# Patient Record
Sex: Male | Born: 1960 | Race: White | Hispanic: No | Marital: Married | Smoking: Former smoker
Health system: Southern US, Community
[De-identification: ages and names within clinical notes are randomized; demographics above are authoritative.]

## PROBLEM LIST (undated history)

## (undated) DIAGNOSIS — H409 Unspecified glaucoma: Secondary | ICD-10-CM

## (undated) DIAGNOSIS — K219 Gastro-esophageal reflux disease without esophagitis: Secondary | ICD-10-CM

## (undated) DIAGNOSIS — R51 Headache: Secondary | ICD-10-CM

## (undated) DIAGNOSIS — G8929 Other chronic pain: Secondary | ICD-10-CM

## (undated) DIAGNOSIS — F419 Anxiety disorder, unspecified: Secondary | ICD-10-CM

## (undated) DIAGNOSIS — M542 Cervicalgia: Secondary | ICD-10-CM

## (undated) DIAGNOSIS — E785 Hyperlipidemia, unspecified: Secondary | ICD-10-CM

## (undated) HISTORY — PX: OTHER SURGICAL HISTORY: SHX169

## (undated) HISTORY — PX: VASECTOMY: SHX75

## (undated) HISTORY — PX: TONSILLECTOMY: SUR1361

---

## 2001-11-15 ENCOUNTER — Encounter (INDEPENDENT_AMBULATORY_CARE_PROVIDER_SITE_OTHER): Payer: Self-pay | Admitting: *Deleted

## 2001-11-15 ENCOUNTER — Ambulatory Visit (HOSPITAL_COMMUNITY): Admission: RE | Admit: 2001-11-15 | Discharge: 2001-11-15 | Payer: Self-pay | Admitting: Gastroenterology

## 2004-03-26 ENCOUNTER — Emergency Department (HOSPITAL_COMMUNITY): Admission: EM | Admit: 2004-03-26 | Discharge: 2004-03-26 | Payer: Self-pay | Admitting: Emergency Medicine

## 2004-12-13 ENCOUNTER — Emergency Department (HOSPITAL_COMMUNITY): Admission: EM | Admit: 2004-12-13 | Discharge: 2004-12-13 | Payer: Self-pay | Admitting: Emergency Medicine

## 2007-04-19 ENCOUNTER — Emergency Department (HOSPITAL_COMMUNITY): Admission: EM | Admit: 2007-04-19 | Discharge: 2007-04-19 | Payer: Self-pay | Admitting: Emergency Medicine

## 2007-07-08 ENCOUNTER — Observation Stay (HOSPITAL_COMMUNITY): Admission: EM | Admit: 2007-07-08 | Discharge: 2007-07-09 | Payer: Self-pay | Admitting: Emergency Medicine

## 2008-05-27 ENCOUNTER — Emergency Department (HOSPITAL_BASED_OUTPATIENT_CLINIC_OR_DEPARTMENT_OTHER): Admission: EM | Admit: 2008-05-27 | Discharge: 2008-05-27 | Payer: Self-pay | Admitting: Emergency Medicine

## 2010-03-18 ENCOUNTER — Emergency Department (HOSPITAL_BASED_OUTPATIENT_CLINIC_OR_DEPARTMENT_OTHER)
Admission: EM | Admit: 2010-03-18 | Discharge: 2010-03-18 | Disposition: A | Discharge status: Home / Self Care | Attending: Emergency Medicine | Admitting: Emergency Medicine

## 2010-03-18 DIAGNOSIS — J329 Chronic sinusitis, unspecified: Secondary | ICD-10-CM | POA: Insufficient documentation

## 2010-03-18 DIAGNOSIS — Z79899 Other long term (current) drug therapy: Secondary | ICD-10-CM | POA: Insufficient documentation

## 2010-03-18 DIAGNOSIS — E789 Disorder of lipoprotein metabolism, unspecified: Secondary | ICD-10-CM | POA: Insufficient documentation

## 2010-03-18 DIAGNOSIS — F3289 Other specified depressive episodes: Secondary | ICD-10-CM | POA: Insufficient documentation

## 2010-03-18 DIAGNOSIS — R070 Pain in throat: Secondary | ICD-10-CM | POA: Insufficient documentation

## 2010-03-18 DIAGNOSIS — K219 Gastro-esophageal reflux disease without esophagitis: Secondary | ICD-10-CM | POA: Insufficient documentation

## 2010-03-18 DIAGNOSIS — J3489 Other specified disorders of nose and nasal sinuses: Secondary | ICD-10-CM | POA: Insufficient documentation

## 2010-03-18 DIAGNOSIS — Z9889 Other specified postprocedural states: Secondary | ICD-10-CM | POA: Insufficient documentation

## 2010-03-18 DIAGNOSIS — F329 Major depressive disorder, single episode, unspecified: Secondary | ICD-10-CM | POA: Insufficient documentation

## 2010-05-05 LAB — RAPID STREP SCREEN (MED CTR MEBANE ONLY): Streptococcus, Group A Screen (Direct): NEGATIVE

## 2010-06-09 NOTE — H&P (Signed)
NAME:  Jordan Barton, Jordan Barton            ACCOUNT NO.:  [id number]   MEDICAL RECORD NO.:  [id number]          PATIENT TYPE:  INP   LOCATION:  3731                         FACILITY:  [facility]   PHYSICIAN:  Beckey Rutter, MD  DATE OF BIRTH:  1960-10-06   DATE OF ADMISSION:  07/08/2007  DATE OF DISCHARGE:                              HISTORY & PHYSICAL   PRIMARY CARE PHYSICIAN:  Annabelle Harman B. Wende Bushy, MD at [facility].  This patient  is unassigned to [facility].   CHIEF COMPLAINT:  Shortness of breath   HISTORY OF PRESENT ILLNESS:  This is a 50 year old pleasant Caucasian  male with past medical history significant for dyslipidemia, depression,  and anxiety came in today because of shortness of breath.  The patient  stated he was feeling some symptoms starting yesterday that he is not  being himself.  This morning, he started to feel shortness of breath and  he is very aware of his respiration.  He often wanted to take deep  breath because he felt he is not breathing enough and sometimes he felt  like he is breathing heavily and he wanted to calm down.  After his  arrival to the emergency department, he started to feel retrosternal  burning sensation that is still continued up to now and he stated that  it felt like heartburn.  The patient has similar presentation previously  to his primary physician and at that time, he had a stress test, as per  him it was negative 1 month ago.  The primary physician prescribed  anxiolytic for him as well as antidepressant.  Currently the patient  denied any stresses and he denied fever, cough and he denied headache.   MEDICAL HISTORY:  1. Dyslipidemia.  2. Depression.  3. Anxiety attacks.   SOCIAL HISTORY:  No drug abuse, nonsmoker, occasional drinker.  He works  as Lawyer, lives in [place] with his wife and daughter.   FAMILY HISTORY:  No significant heart disease in the family.   MEDICATION ALLERGY:  MINOCIN.   MEDICATION:  1.  Aspirin.  2. Celexa.  3. Cyclobenzaprine.  4. Lipitor.  5. Temazepam.  6. Zyprexa.  7. Etodolac.   REVIEW OF SYSTEMS:  As per HPI.  The patient has some sternal burning  sensation that started after arrival to the emergency department.  He  denied severe chest pain or abdominal pain.  He also denied fever, cough  or headache.  The rest of the 12-point review of systems is  nonpertinent.   PHYSICAL EXAMINATION:  VIAL SIGNS:  His temperature 97.8, blood pressure  is 134/68, pulse 70, and respiratory rate is 18.  HEAD:  Atraumatic and normocephalic.  EYES:  PERRL.  MOUTH:  Moist.  No ulcer.  NECK:  Supple.  No JVD.  PRECORDIUM:  First and second heart sounds audible. No added sounds.  LUNGS:  Bilateral fair air entry.  No adventitious sounds.  ABDOMEN:  Soft, nontender.  Bowel sounds present.  EXTREMITIES:  No lower extremities edema.  GENITOURINARY:  No CVA tenderness.  NEUROLOGICAL:  Alert and oriented x3.  Moving all his extremities  spontaneously.   LABS AND X-RAY:  Myoglobin is 74.3, CK-MB is 1.1, troponin is less than  0.05, PTT is 29, PT is 13.6, and INR is 1.0.  Sodium is 139, potassium  4.6 chloride is 104, carbon dioxide 28, glucose 99, BUN 10, creatinine  1.17, and calcium 9.6.  Total protein is 7.2, albumin is 4.1, alkaline  phosphatase is 49, the second troponin is showing less than 0.05 and the  CK-MB is less than 1.0.  White blood count is 7.70, platelet count is  184, hemoglobin is 14.7, and hematocrit is 42.0.  Urinalysis showing  yellow clear urine, negative for nitrate and leukocyte esterase.  EKG is  sinus 68 beats per minute, normal axis, normal interval, normal P, QRS  complex and normal ST and T-wave.  Chest X-Ray:  No acute abnormalities.   ASSESSMENT AND PLAN:  This is a 50 year old male presented with  shortness of breath and chest discomfort.  The chest pain is atypical  for cardiogenic origin.  Nevertheless the patient will be admitted to  rule out  acute coronary syndromes/myocardial infarction.  Shortness of breath and difficulty breathing is likely secondary to  anxiety attack especially with the fact that he has not been compliant  with his antipsychotic medication and he has skipped the dose for the  last 2 days.  1. The patient will be admitted to telemetry floor.  2. We will cycle cardiac enzymes and we will obtain frequent EKGs.  3. The patient will be started on Xanax and we will continue      antidepressant medications.  4. We will give the patient aspirin as well as proton pump inhibitor      to help with his retrosternal burning symptoms.  For deep vein      thrombosis prophylaxis, we will start Lovenox.  For      gastrointestinal prophylaxis, we will start Protonix as discussed      above. We might need to obtain the result of his stress test from      his primary physician.      Beckey Rutter, MD  Electronically Signed     EME/MEDQ  D:  07/08/2007  T:  07/09/2007  Job:  [id number]

## 2010-06-12 NOTE — Op Note (Signed)
   NAME:  Jordan Barton, Jordan Barton                        ACCOUNT NO.:  [id number]   MEDICAL RECORD NO.:  [id number]                   PATIENT TYPE:  AMB   LOCATION:  ENDO                                 FACILITY:  [facility]   PHYSICIAN:  Anselmo Rod, M.D.               DATE OF BIRTH:  22-May-1960   DATE OF PROCEDURE:  11/15/2001  DATE OF DISCHARGE:                                 OPERATIVE REPORT   PROCEDURE PERFORMED:  Colonoscopy with snare polypectomy x1.   ENDOSCOPIST:  Anselmo Rod, M.D.   INSTRUMENT USED:  Olympus videocolonoscope.   INDICATIONS FOR PROCEDURE:  History of rectal bleeding in a 49 year old  white male, rule out colonic polyps, masses, hemorrhoids.   PREPROCEDURE PREPARATION:  Informed consent was procured from the patient.  The patient was fasted for eight hours prior to the procedure and prepped  with a bottle of magnesium Citrate and a gallon of NuLytely the night prior  to the procedure.   PREPROCEDURE PHYSICAL:  VITAL SIGNS:  The patient had stable vital signs.  NECK:  Supple.  CHEST:  Clear to auscultation.  ABDOMEN:  Soft with normal bowel sounds.   DESCRIPTION OF PROCEDURE:  The patient was placed in the left lateral  decubitus position, sedated with 70 mg of Demerol and 7 mg of Versed  intravenously.  Once the patient was adequately sedation and maintained on  low-flow oxygen and continuous cardiac monitoring, the Olympus  videocolonoscope was advanced from the rectum to the cecum without  difficulty.  The patient had some residual stool in the right colon.  Multiple washings were done.  The appendiceal orifice and the ileocecal  valve were clearly visualized and photographed.  A large pedunculated polyp  which seemed inflammatory in nature was snared from 25 cm by snare  polypectomy.  The rest of the colonic mucosa appeared normal.  There was no  evidence of bleeding from the polypectomy site.  The patient tolerated the  procedure well without  complications.  There was no evidence of hemorrhoidal  disease on retroflexion in the rectum or anal inspection.   IMPRESSION:  1. Large inflammatory-appearing pedunculated polyp removed at 25 cm by snare     polypectomy.  2. Otherwise normal-appearing colon up to the cecum.    RECOMMENDATIONS:  1. Await pathology results.  2. Avoid all nonsteroidals including aspirin for the next 3-4 weeks.  3. Outpatient followup in the next two weeks for further recommendations.                                               Anselmo Rod, M.D.    JNM/MEDQ  D:  11/15/2001  T:  11/15/2001  Job:  [id number]   cc:   Donnamarie Rossetti, M.D.

## 2010-08-14 ENCOUNTER — Encounter: Payer: Self-pay | Admitting: *Deleted

## 2010-08-14 ENCOUNTER — Emergency Department (HOSPITAL_BASED_OUTPATIENT_CLINIC_OR_DEPARTMENT_OTHER)
Admission: EM | Admit: 2010-08-14 | Discharge: 2010-08-14 | Disposition: A | Discharge status: Home / Self Care | Attending: Emergency Medicine | Admitting: Emergency Medicine

## 2010-08-14 ENCOUNTER — Emergency Department (INDEPENDENT_AMBULATORY_CARE_PROVIDER_SITE_OTHER)

## 2010-08-14 DIAGNOSIS — R0989 Other specified symptoms and signs involving the circulatory and respiratory systems: Secondary | ICD-10-CM

## 2010-08-14 DIAGNOSIS — R05 Cough: Secondary | ICD-10-CM

## 2010-08-14 DIAGNOSIS — R0981 Nasal congestion: Secondary | ICD-10-CM

## 2010-08-14 DIAGNOSIS — R070 Pain in throat: Secondary | ICD-10-CM

## 2010-08-14 DIAGNOSIS — J40 Bronchitis, not specified as acute or chronic: Secondary | ICD-10-CM | POA: Insufficient documentation

## 2010-08-14 MED ORDER — OXYMETAZOLINE HCL 0.05 % NA SOLN
1.0000 | Freq: Once | NASAL | Status: AC
  Administered 2010-08-14: 1 via NASAL
  Filled 2010-08-14: qty 15

## 2010-08-14 MED ORDER — ALBUTEROL SULFATE HFA 108 (90 BASE) MCG/ACT IN AERS
2.0000 | INHALATION_SPRAY | Freq: Once | RESPIRATORY_TRACT | Status: AC
  Administered 2010-08-14: 2 via RESPIRATORY_TRACT
  Filled 2010-08-14: qty 6.7

## 2010-08-14 NOTE — ED Provider Notes (Signed)
History     Chief Complaint  Patient presents with  . Nasal Congestion   HPI History of Present Illness   Patient Identification Jordan Barton is a 50 y.o. male.  Patient information was obtained from patient. History/Exam limitations: none. Patient presented to the Emergency Department ambulatory.  Chief Complaint  Nasal Congestion   The patient complains of myalgias, nasal congestion, productive cough with sputum described as tan and brown, sore throat and sweats. Onset of symptoms was gradual starting 3 days ago. Symptom course has been worsening, while severity at onset was mild. Symptoms tend to occur with cough. Symptoms are aggravated by nothing, alleviated by nothing and have been associated with cough with tan and brown sputum. Past respiratory history includes none. Patient denies travel or residence in the [facility] [place]. Patient denies travel or residence in [place] country for TB. Patient denies occupational or hobby exposure to irritants such as sulfiting agents, tartrazine, epoxies, chemical, flour, wood dust.) Risks for MI include Age > 67 and male. Risks for PE include none. Care prior to arrival consisted of cough syrup, with minimal relief.   History reviewed. No pertinent past medical history. History reviewed. No pertinent family history. Current Facility-Administered Medications  Medication Dose Route Frequency Provider Last Rate Last Dose  . oxymetazoline (AFRIN) 0.05 % nasal spray 1 spray  1 spray Each Nare Once Ethelda Chick, MD       Current Outpatient Prescriptions  Medication Sig Dispense Refill  . atorvastatin (LIPITOR) 10 MG tablet Take 10 mg by mouth daily.        Marland Kitchen esomeprazole (NEXIUM) 20 MG capsule Take 20 mg by mouth daily before breakfast.         No Known Allergies History   Social History  . Marital Status: Married    Spouse Name: N/A    Number of Children: N/A  . Years of Education: N/A   Occupational History  . Not on file.    Social History Main Topics  . Smoking status: Never Smoker   . Smokeless tobacco: Not on file  . Alcohol Use:   . Drug Use: No  . Sexually Active:    Other Topics Concern  . Not on file   Social History Narrative  . No narrative on file   Review of Systems Pertinent items are noted in HPI. 10 Systems reviewed and are negative for acute change except as noted in the HPI.  Physical Exam   BP 127/82  Pulse 87  Temp 98.5 F (36.9 C)  Resp 20  SpO2 98% BP 127/82  Pulse 87  Temp 98.5 F (36.9 C)  Resp 20  SpO2 98% General appearance: alert, cooperative and no distress Head: Normocephalic, without obvious abnormality, atraumatic Eyes: negative findings: conjunctivae and sclerae normal and pupils equal, round, reactive to light and accomodation Throat: normal findings: lips normal without lesions, palate normal, tongue midline and normal and soft palate, uvula, and tonsils normal Neck: no adenopathy and supple, symmetrical, trachea midline Lungs: clear to auscultation bilaterally and no increased respiratory effort Heart: regular rate and rhythm, S1, S2 normal, no murmur, click, rub or gallop Abdomen: soft, non-tender; bowel sounds normal; no masses,  no organomegaly Extremities: extremities normal, atraumatic, no cyanosis or edema Pulses: 2+ and symmetric Skin: Skin color, texture, turgor normal. No rashes or lesions Lymph nodes: Cervical, supraclavicular, and axillary nodes normal.  ED Course   Studies: Imaging: X-ray: Chest: was negative for infiltrate, effusion, pneumothorax, or wide mediastinum and reviewed by  me  Records Reviewed: Nursing notes.  Treatments: pt given albuterol MDI, afrin nasal spray    Disposition: Home Nonsteroidals Albuterol MDI, afrin nasal spray x 3 days only History reviewed. No pertinent past medical history.  History reviewed. No pertinent past surgical history.  History reviewed. No pertinent family history.  History  Substance  Use Topics  . Smoking status: Never Smoker   . Smokeless tobacco: Not on file  . Alcohol Use:       Review of Systems  Physical Exam  BP 127/82  Pulse 87  Temp 98.5 F (36.9 C)  Resp 20  SpO2 98%  Physical Exam  ED Course  Procedures  MDM Pt with normal CXR, has had cough, nasal congestion and pain with coughing in throat- x 3 days.  Nonsmoker.  No chest pain.  No leg swelling no fever.  Likely bronchitis versus other viral syndrome. Pt given symptomatic treatment in the ED and strict return precautions.  Pt agreeable with plan.

## 2010-08-14 NOTE — ED Notes (Signed)
Pt presents to ED today with nasal congestion and sore throat for the last 3 days.  Pt states pain has steadily increased.  Pt states he tried 2 doses of Robitussin with no relief of sx.  Pt has not tried sudafed or any other nasal decongestion.

## 2010-10-19 LAB — CBC
MCHC: 34.4
MCV: 90.5
Platelets: 235
RBC: 4.62
WBC: 5.7

## 2010-10-19 LAB — DIFFERENTIAL
Basophils Absolute: 0
Basophils Relative: 0
Eosinophils Absolute: 0.1
Eosinophils Relative: 1
Lymphocytes Relative: 34
Lymphs Abs: 1.9
Monocytes Absolute: 0.4
Monocytes Relative: 8
Neutro Abs: 3.3
Neutrophils Relative %: 57

## 2010-10-19 LAB — BASIC METABOLIC PANEL WITH GFR
BUN: 15
CO2: 29
Calcium: 9
Chloride: 105
Creatinine, Ser: 1.06
GFR calc non Af Amer: >60
Glucose, Bld: 99
Potassium: 4.3
Sodium: 139

## 2010-10-19 LAB — POCT CARDIAC MARKERS
Myoglobin, poc: 44
Operator id: 4531

## 2010-10-22 LAB — COMPREHENSIVE METABOLIC PANEL
AST: 24
Albumin: 3.7
Albumin: 4.1
Alkaline Phosphatase: 44
BUN: 10
BUN: 13
CO2: 28
Calcium: 9.6
Chloride: 101
Chloride: 104
Creatinine, Ser: 1.17
Creatinine, Ser: 1.2
Glucose, Bld: 98
Total Bilirubin: 0.8
Total Bilirubin: 1
Total Protein: 6.4
Total Protein: 7.2

## 2010-10-22 LAB — CBC
HCT: 42.8
MCV: 92.7
Platelets: 184
RBC: 4.61
WBC: 7.7

## 2010-10-22 LAB — POCT I-STAT 3, ART BLOOD GAS (G3+)
Acid-Base Excess: 3 — ABNORMAL HIGH
Bicarbonate: 29.1 — ABNORMAL HIGH
Operator id: 276051
pH, Arterial: 7.406

## 2010-10-22 LAB — CK TOTAL AND CKMB (NOT AT ARMC)
CK, MB: 0.7
Total CK: 131

## 2010-10-22 LAB — CARDIAC PANEL(CRET KIN+CKTOT+MB+TROPI)
CK, MB: 0.6
Relative Index: 0.5
Relative Index: 0.5
Total CK: 135
Troponin I: 0.01
Troponin I: <0.01

## 2010-10-22 LAB — POCT I-STAT, CHEM 8
Creatinine, Ser: 1.2
Glucose, Bld: 92
Hemoglobin: 15
Sodium: 139
TCO2: 29

## 2010-10-22 LAB — TROPONIN I: Troponin I: <0.01

## 2010-10-22 LAB — LIPID PANEL
Cholesterol: 119
HDL: 33 — ABNORMAL LOW

## 2010-10-22 LAB — POCT CARDIAC MARKERS
CKMB, poc: <1 — ABNORMAL LOW
Myoglobin, poc: 72.5
Myoglobin, poc: 74.3
Operator id: 196461
Operator id: 196461
Troponin i, poc: <0.05

## 2010-10-22 LAB — URINALYSIS, ROUTINE W REFLEX MICROSCOPIC
Hgb urine dipstick: NEGATIVE
Protein, ur: NEGATIVE
Urobilinogen, UA: 0.2

## 2010-10-22 LAB — APTT: aPTT: 29

## 2010-10-22 LAB — DIFFERENTIAL
Lymphs Abs: 1.3
Monocytes Relative: 6
Neutro Abs: 5.9
Neutrophils Relative %: 77

## 2010-10-22 LAB — D-DIMER, QUANTITATIVE: D-Dimer, Quant: 0.47

## 2010-10-22 LAB — MAGNESIUM: Magnesium: 2.3

## 2010-10-22 LAB — PHOSPHORUS: Phosphorus: 4.3

## 2010-10-22 LAB — PROTIME-INR: Prothrombin Time: 13.6

## 2010-10-22 LAB — TSH: TSH: 2.129

## 2011-12-09 ENCOUNTER — Observation Stay (HOSPITAL_BASED_OUTPATIENT_CLINIC_OR_DEPARTMENT_OTHER)
Admission: EM | Admit: 2011-12-09 | Discharge: 2011-12-09 | Disposition: A | Discharge status: Home / Self Care | Attending: Emergency Medicine | Admitting: Emergency Medicine

## 2011-12-09 ENCOUNTER — Encounter (HOSPITAL_BASED_OUTPATIENT_CLINIC_OR_DEPARTMENT_OTHER): Payer: Self-pay | Admitting: Emergency Medicine

## 2011-12-09 ENCOUNTER — Emergency Department (HOSPITAL_BASED_OUTPATIENT_CLINIC_OR_DEPARTMENT_OTHER)

## 2011-12-09 ENCOUNTER — Observation Stay (HOSPITAL_COMMUNITY)

## 2011-12-09 DIAGNOSIS — R29898 Other symptoms and signs involving the musculoskeletal system: Secondary | ICD-10-CM | POA: Insufficient documentation

## 2011-12-09 DIAGNOSIS — R42 Dizziness and giddiness: Principal | ICD-10-CM | POA: Insufficient documentation

## 2011-12-09 DIAGNOSIS — E785 Hyperlipidemia, unspecified: Secondary | ICD-10-CM | POA: Insufficient documentation

## 2011-12-09 DIAGNOSIS — R0602 Shortness of breath: Secondary | ICD-10-CM | POA: Insufficient documentation

## 2011-12-09 DIAGNOSIS — G459 Transient cerebral ischemic attack, unspecified: Secondary | ICD-10-CM

## 2011-12-09 HISTORY — DX: Anxiety disorder, unspecified: F41.9

## 2011-12-09 HISTORY — DX: Hyperlipidemia, unspecified: E78.5

## 2011-12-09 HISTORY — DX: Gastro-esophageal reflux disease without esophagitis: K21.9

## 2011-12-09 LAB — HEMOGLOBIN A1C
Hgb A1c MFr Bld: 5.7 % — ABNORMAL HIGH (ref <5.7)
Mean Plasma Glucose: 117 mg/dL — ABNORMAL HIGH (ref <117)

## 2011-12-09 LAB — RAPID URINE DRUG SCREEN, HOSP PERFORMED
Benzodiazepines: POSITIVE — AB
Cocaine: NOT DETECTED
Opiates: NOT DETECTED
Tetrahydrocannabinol: NOT DETECTED

## 2011-12-09 LAB — GLUCOSE, CAPILLARY: Glucose-Capillary: 96 mg/dL (ref 70–99)

## 2011-12-09 LAB — PROTIME-INR
INR: 0.91 (ref 0.00–1.49)
Prothrombin Time: 12.2 seconds (ref 11.6–15.2)

## 2011-12-09 LAB — LIPID PANEL
Cholesterol: 165 mg/dL (ref 0–200)
Total CHOL/HDL Ratio: 3.5 RATIO

## 2011-12-09 LAB — CBC WITH DIFFERENTIAL/PLATELET
Basophils Absolute: 0 10*3/uL (ref 0.0–0.1)
Eosinophils Relative: 2 % (ref 0–5)
Lymphocytes Relative: 42 % (ref 12–46)
Lymphs Abs: 2.6 10*3/uL (ref 0.7–4.0)
MCV: 90.7 fL (ref 78.0–100.0)
Neutro Abs: 2.8 10*3/uL (ref 1.7–7.7)
Neutrophils Relative %: 45 % (ref 43–77)
Platelets: 195 10*3/uL (ref 150–400)
RBC: 4.61 MIL/uL (ref 4.22–5.81)
RDW: 13.4 % (ref 11.5–15.5)
WBC: 6.2 10*3/uL (ref 4.0–10.5)

## 2011-12-09 LAB — COMPREHENSIVE METABOLIC PANEL
ALT: 41 U/L (ref 0–53)
AST: 25 U/L (ref 0–37)
Alkaline Phosphatase: 66 U/L (ref 39–117)
CO2: 29 mEq/L (ref 19–32)
Calcium: 9.5 mg/dL (ref 8.4–10.5)
Chloride: 101 mEq/L (ref 96–112)
GFR calc Af Amer: 87 mL/min — ABNORMAL LOW (ref >90)
GFR calc non Af Amer: 75 mL/min — ABNORMAL LOW (ref >90)
Glucose, Bld: 113 mg/dL — ABNORMAL HIGH (ref 70–99)
Potassium: 3.7 mEq/L (ref 3.5–5.1)
Sodium: 140 mEq/L (ref 135–145)

## 2011-12-09 LAB — TROPONIN I: Troponin I: <0.3 ng/mL (ref <0.30)

## 2011-12-09 LAB — CK: Total CK: 137 U/L (ref 7–232)

## 2011-12-09 LAB — URINALYSIS, ROUTINE W REFLEX MICROSCOPIC
Bilirubin Urine: NEGATIVE
Hgb urine dipstick: NEGATIVE
Ketones, ur: NEGATIVE mg/dL
Specific Gravity, Urine: 1.024 (ref 1.005–1.030)
Urobilinogen, UA: 0.2 mg/dL (ref 0.0–1.0)

## 2011-12-09 MED ORDER — ENOXAPARIN SODIUM 40 MG/0.4ML ~~LOC~~ SOLN
40.0000 mg | SUBCUTANEOUS | Status: DC
  Filled 2011-12-09: qty 0.4

## 2011-12-09 MED ORDER — SODIUM CHLORIDE 0.9 % IV BOLUS (SEPSIS)
1000.0000 mL | Freq: Once | INTRAVENOUS | Status: AC
  Administered 2011-12-09: 1000 mL via INTRAVENOUS

## 2011-12-09 MED ORDER — ACETAMINOPHEN 325 MG PO TABS: 650.0000 mg | ORAL_TABLET | ORAL | Status: DC | PRN

## 2011-12-09 MED ORDER — ASPIRIN 300 MG RE SUPP: 300.0000 mg | Freq: Every day | RECTAL | Status: DC

## 2011-12-09 MED ORDER — IBUPROFEN 400 MG PO TABS
600.0000 mg | ORAL_TABLET | Freq: Once | ORAL | Status: AC
  Administered 2011-12-09: 600 mg via ORAL
  Filled 2011-12-09: qty 1

## 2011-12-09 NOTE — ED Provider Notes (Signed)
History     CSN: [id number]  Arrival date & time 12/09/11  0425   First MD Initiated Contact with Patient 12/09/11 0435      Chief Complaint  Patient presents with  . Dizziness    (Consider location/radiation/quality/duration/timing/severity/associated sxs/prior treatment) HPI Level 5 caveat due to confusion Pt brought to the ED by wife who give history. Reports he woke up from sleep a short time prior to arrival complaining of not feeling well, vague complaints of weakness and dizziness, ?R sided weakness. He denies any chest pain. Has had SOB and cough no fever. He was in his normal state of health at bedtime which was >6 hours ago. He has recently begun taking an unknown sleep aid as needed. Wife is unsure if he took any prior to going to bed tonight or if he drank any EtOH tonight.   Past Medical History  Diagnosis Date  . Hyperlipemia     History reviewed. No pertinent past surgical history.  History reviewed. No pertinent family history.  History  Substance Use Topics  . Smoking status: Never Smoker   . Smokeless tobacco: Not on file  . Alcohol Use: Yes     Comment: occasional      Review of Systems Unable to assess due to mental status.    Allergies  Penicillins  Home Medications   Current Outpatient Rx  Name  Route  Sig  Dispense  Refill  . ATORVASTATIN CALCIUM 10 MG PO TABS   Oral   Take 10 mg by mouth daily.           Marland Kitchen ESOMEPRAZOLE MAGNESIUM 20 MG PO CPDR   Oral   Take 20 mg by mouth daily before breakfast.             BP 133/82  Pulse 71  Resp 18  SpO2 100%  Physical Exam  Nursing note and vitals reviewed. Constitutional: He appears well-developed and well-nourished. He appears listless.  HENT:  Head: Normocephalic and atraumatic.  Eyes: EOM are normal. Pupils are equal, round, and reactive to light.  Neck: Normal range of motion. Neck supple.  Cardiovascular: Normal rate, normal heart sounds and intact distal pulses.     Pulmonary/Chest: Effort normal and breath sounds normal.  Abdominal: Bowel sounds are normal. He exhibits no distension. There is no tenderness.  Musculoskeletal: Normal range of motion. He exhibits no edema and no tenderness.  Neurological: He has normal strength. He appears listless. He is disoriented. No cranial nerve deficit or sensory deficit.       Pt very sleepy but arouses to name and answers questions intermittently  Skin: Skin is warm and dry. No rash noted.  Psychiatric: He has a normal mood and affect.    ED Course  Procedures (including critical care time)  Labs Reviewed  COMPREHENSIVE METABOLIC PANEL - Abnormal; Notable for the following:    Glucose, Bld 113 (*)     GFR calc non Af Amer 75 (*)     GFR calc Af Amer 87 (*)     All other components within normal limits  URINE RAPID DRUG SCREEN (HOSP PERFORMED) - Abnormal; Notable for the following:    Benzodiazepines POSITIVE (*)     All other components within normal limits  SALICYLATE LEVEL - Abnormal; Notable for the following:    Salicylate Lvl <2.0 (*)     All other components within normal limits  CBC WITH DIFFERENTIAL  URINALYSIS, ROUTINE W REFLEX MICROSCOPIC  LIPASE, BLOOD  CK  TROPONIN I  ETHANOL  ACETAMINOPHEN LEVEL  PROTIME-INR  APTT  GLUCOSE, CAPILLARY   Dg Chest 2 View  12/09/2011  *RADIOLOGY REPORT*  Clinical Data: Dizziness, altered mental status, cough.  CHEST - 2 VIEW  Comparison: 08/14/2010  Findings: Heart and mediastinal contours are within normal limits. No focal opacities or effusions.  No acute bony abnormality.  IMPRESSION: No active cardiopulmonary disease.   Original Report Authenticated By: Charlett Nose, M.D.    Ct Head Wo Contrast  12/09/2011  *RADIOLOGY REPORT*  Clinical Data: Dizziness, altered mental status.  CT HEAD WITHOUT CONTRAST  Technique:  Contiguous axial images were obtained from the base of the skull through the vertex without contrast.  Comparison: None.  Findings: No  acute intracranial abnormality.  Specifically, no hemorrhage, hydrocephalus, mass lesion, acute infarction, or significant intracranial injury.  No acute calvarial abnormality. Visualized paranasal sinuses and mastoids clear.  Orbital soft tissues unremarkable.  IMPRESSION: Normal study.   Original Report Authenticated By: Charlett Nose, M.D.      No diagnosis found.    MDM   Date: 12/09/2011  Rate: 70  Rhythm: normal sinus rhythm  QRS Axis: normal  Intervals: normal  ST/T Wave abnormalities: normal  Conduction Disutrbances: none  Narrative Interpretation: unremarkable  5:48 AM Pt more alert now, states he did not take any of his sleeping meds last night. He reports R face 'fullness' as an additional symptom he didn't mention earlier. ?TIA as the cause of his symptoms. Will transfer to [facility] for TIA protocol. Discussed with Dr. Nicanor Alcon who will accept.             Charles B. Bernette Mayers, MD 12/09/11 [phone]

## 2011-12-09 NOTE — Progress Notes (Signed)
  Echocardiogram 2D Echocardiogram has been performed.  Jordan Barton 12/09/2011, 8:36 AM

## 2011-12-09 NOTE — ED Notes (Signed)
Patient transported to Ultrasound 

## 2011-12-09 NOTE — ED Notes (Signed)
Pt sent here from [facility] to r/o TIA.  Pt awoke from sleep with R sided numbness, R eye pressure and feeling "out of it".  Per family, they do not feel pt took [place] or xanex last night.  Pt ao x 4 presently.

## 2011-12-09 NOTE — ED Provider Notes (Signed)
7:19 AM Patient has arrived from [facility] for TIA protocol. Vague right-sided symptoms per note. Patient to be placed on TIA protocol for further evaluation.   Per nurse, patient currently asymptomatic.   7:37 AM Patient seen and examined. R arm numbness and tingling resolving -- states still feels a little heavy. No facial sx. Patient and wife agree with plan.   7:38 AM Exam:  Gen NAD; Neck no bruits; Heart RRR, nml S1,S2, no m/r/g; Lungs CTAB; Abd soft, NT, no rebound or guarding; Ext 2+ pedal pulses bilaterally, no edema; Neuro CAOx3, CN III-XII grossly intact, moves all extremities, coordination normal.   10:55 AM Echo, carotids neg. MRI neg. MRI reviewed by myself. Will start on 81mg  ASA per day and have patient f/u with PCP.   Patient has new PCP with [facility]. Encouraged them to obtain records and forward. Patient has upcoming PCP appt already scheduled in 1-2 weeks.   Pt/wife told to return if pt has weakness in his arms or legs, slurred speech, trouble walking or talking, confusion, or trouble with your balance. Patient verbalizes understanding and agrees with plan.       Renne Crigler, [place] 12/09/11 1250

## 2011-12-09 NOTE — ED Notes (Signed)
Pt alert and oriented, pt able to mae's without difficulty, grip strength remains the same on both sides, no facial droop. Pt  States he feels a little disoriented, but is a &o x 4 when questioned, c/o numbness to right side of face

## 2011-12-09 NOTE — ED Notes (Signed)
Pt awoke, dizzy, c/o difficulty thinking

## 2011-12-09 NOTE — Progress Notes (Signed)
VASCULAR LAB PRELIMINARY  PRELIMINARY  PRELIMINARY  PRELIMINARY  Carotid duplex  completed.    Preliminary report:  Bilateral:  No evidence of hemodynamically significant internal carotid artery stenosis.   Vertebral artery flow is antegrade.      Felicha Frayne, RVT 12/09/2011, 8:36 AM

## 2011-12-10 NOTE — ED Provider Notes (Signed)
Medical screening examination/treatment/procedure(s) were performed by non-physician practitioner and as supervising physician I was immediately available for consultation/collaboration.  Geoffery Lyons, MD 12/10/11 [phone]

## 2012-03-21 ENCOUNTER — Other Ambulatory Visit: Payer: Self-pay | Admitting: Orthopedic Surgery

## 2012-03-21 DIAGNOSIS — M5020 Other cervical disc displacement, unspecified cervical region: Secondary | ICD-10-CM

## 2012-03-22 ENCOUNTER — Ambulatory Visit
Admission: RE | Admit: 2012-03-22 | Discharge: 2012-03-22 | Disposition: A | Discharge status: Home / Self Care | Source: Ambulatory Visit | Attending: Orthopedic Surgery | Admitting: Orthopedic Surgery

## 2012-03-22 DIAGNOSIS — M5020 Other cervical disc displacement, unspecified cervical region: Secondary | ICD-10-CM

## 2012-04-04 ENCOUNTER — Encounter (HOSPITAL_COMMUNITY): Payer: Self-pay | Admitting: Pharmacy Technician

## 2012-04-07 ENCOUNTER — Encounter (HOSPITAL_COMMUNITY): Payer: Self-pay

## 2012-04-07 ENCOUNTER — Encounter (HOSPITAL_COMMUNITY)
Admission: RE | Admit: 2012-04-07 | Discharge: 2012-04-07 | Disposition: A | Discharge status: Home / Self Care | Source: Ambulatory Visit | Attending: Orthopedic Surgery | Admitting: Orthopedic Surgery

## 2012-04-07 HISTORY — DX: Other chronic pain: G89.29

## 2012-04-07 HISTORY — DX: Headache: R51

## 2012-04-07 HISTORY — DX: Unspecified glaucoma: H40.9

## 2012-04-07 HISTORY — DX: Cervicalgia: M54.2

## 2012-04-07 LAB — SURGICAL PCR SCREEN: MRSA, PCR: NEGATIVE

## 2012-04-07 NOTE — Progress Notes (Addendum)
Contacted Delsa Grana, NP [phone] office, requested lab work from 03/28/2012

## 2012-04-07 NOTE — Progress Notes (Signed)
04/07/12 1525  OBSTRUCTIVE SLEEP APNEA  Have you ever been diagnosed with sleep apnea through a sleep study? No  Do you snore loudly (loud enough to be heard through closed doors)?  0  Do you often feel tired, fatigued, or sleepy during the daytime? 0  Has anyone observed you stop breathing during your sleep? 0  Do you have, or are you being treated for high blood pressure? 0  BMI more than 35 kg/m2? 1  Age over 53 years old? 1  Neck circumference greater than 40 cm/18 inches? 1  Gender: 1  Obstructive Sleep Apnea Score 4  Score 4 or greater  Results sent to PCP

## 2012-04-07 NOTE — Pre-Procedure Instructions (Signed)
Jordan Barton  04/07/2012   Your procedure is scheduled on:  Monday April 10, 2012  Report to [facility] at 11:00 AM.  Call this number if you have problems the morning of surgery: [phone]   Remember:   Do not eat food or drink liquids after midnight.   Take these medicines the morning of surgery with A SIP OF WATER: valium, nexium, hydrocodone   Do not wear jewelry, make-up or nail polish.  Do not wear lotions, powders, or perfumes.   Do not shave 48 hours prior to surgery. Men may shave face and neck.  Do not bring valuables to the hospital.  Contacts, dentures or bridgework may not be worn into surgery.  Leave suitcase in the car. After surgery it may be brought to your room.  For patients admitted to the hospital, checkout time is 11:00 AM the day of  discharge.   Patients discharged the day of surgery will not be allowed to drive  home.  Name and phone number of your driver: family / friend  Special Instructions: Shower using CHG 2 nights before surgery and the night before surgery.  If you shower the day of surgery use CHG.  Use special wash - you have one bottle of CHG for all showers.  You should use approximately 1/3 of the bottle for each shower.   Please read over the following fact sheets that you were given: Pain Booklet, Coughing and Deep Breathing, MRSA Information and Surgical Site Infection Prevention

## 2012-04-10 ENCOUNTER — Inpatient Hospital Stay (HOSPITAL_COMMUNITY): Admitting: Anesthesiology

## 2012-04-10 ENCOUNTER — Encounter (HOSPITAL_COMMUNITY): Payer: Self-pay | Admitting: *Deleted

## 2012-04-10 ENCOUNTER — Inpatient Hospital Stay (HOSPITAL_COMMUNITY)

## 2012-04-10 ENCOUNTER — Encounter (HOSPITAL_COMMUNITY): Payer: Self-pay | Admitting: Anesthesiology

## 2012-04-10 ENCOUNTER — Encounter (HOSPITAL_COMMUNITY)
Admission: RE | Disposition: A | Discharge status: Home / Self Care | Payer: Self-pay | Source: Ambulatory Visit | Attending: Orthopedic Surgery

## 2012-04-10 ENCOUNTER — Inpatient Hospital Stay (HOSPITAL_COMMUNITY)
Admission: RE | Admit: 2012-04-10 | Discharge: 2012-04-11 | DRG: 473 | Disposition: A | Discharge status: Home / Self Care | Source: Ambulatory Visit | Attending: Orthopedic Surgery | Admitting: Orthopedic Surgery

## 2012-04-10 DIAGNOSIS — Z01812 Encounter for preprocedural laboratory examination: Secondary | ICD-10-CM

## 2012-04-10 DIAGNOSIS — F411 Generalized anxiety disorder: Secondary | ICD-10-CM | POA: Diagnosis present

## 2012-04-10 DIAGNOSIS — M47812 Spondylosis without myelopathy or radiculopathy, cervical region: Principal | ICD-10-CM | POA: Diagnosis present

## 2012-04-10 DIAGNOSIS — E785 Hyperlipidemia, unspecified: Secondary | ICD-10-CM | POA: Diagnosis present

## 2012-04-10 DIAGNOSIS — H409 Unspecified glaucoma: Secondary | ICD-10-CM | POA: Diagnosis present

## 2012-04-10 HISTORY — PX: ANTERIOR CERVICAL DECOMP/DISCECTOMY FUSION: SHX1161

## 2012-04-10 SURGERY — ANTERIOR CERVICAL DECOMPRESSION/DISCECTOMY FUSION 2 LEVEL/HARDWARE REMOVAL
Anesthesia: General | Site: Spine Cervical | Wound class: Clean

## 2012-04-10 MED ORDER — DEXAMETHASONE SODIUM PHOSPHATE 4 MG/ML IJ SOLN
4.0000 mg | Freq: Four times a day (QID) | INTRAMUSCULAR | Status: DC
  Administered 2012-04-10 – 2012-04-11 (×3): 4 mg via INTRAVENOUS
  Filled 2012-04-10 (×7): qty 1

## 2012-04-10 MED ORDER — PROPOFOL 10 MG/ML IV BOLUS
INTRAVENOUS | Status: DC | PRN
  Administered 2012-04-10: 170 mg via INTRAVENOUS
  Administered 2012-04-10: 30 mg via INTRAVENOUS

## 2012-04-10 MED ORDER — 0.9 % SODIUM CHLORIDE (POUR BTL) OPTIME
TOPICAL | Status: DC | PRN
  Administered 2012-04-10: 1000 mL

## 2012-04-10 MED ORDER — ONDANSETRON HCL 4 MG PO TABS: 4.0000 mg | ORAL_TABLET | Freq: Three times a day (TID) | ORAL | Status: AC | PRN

## 2012-04-10 MED ORDER — THROMBIN 20000 UNITS EX SOLR
CUTANEOUS | Status: DC | PRN
  Administered 2012-04-10: 14:00:00 via TOPICAL

## 2012-04-10 MED ORDER — MIDAZOLAM HCL 5 MG/5ML IJ SOLN
INTRAMUSCULAR | Status: DC | PRN
  Administered 2012-04-10: 2 mg via INTRAVENOUS

## 2012-04-10 MED ORDER — HYDROMORPHONE HCL PF 1 MG/ML IJ SOLN
0.2500 mg | INTRAMUSCULAR | Status: DC | PRN
  Administered 2012-04-10: 0.5 mg via INTRAVENOUS

## 2012-04-10 MED ORDER — ONDANSETRON HCL 4 MG/2ML IJ SOLN
INTRAMUSCULAR | Status: DC | PRN
  Administered 2012-04-10: 4 mg via INTRAVENOUS

## 2012-04-10 MED ORDER — CEFAZOLIN SODIUM-DEXTROSE 2-3 GM-% IV SOLR
2.0000 g | INTRAVENOUS | Status: AC
  Administered 2012-04-10: 2 g via INTRAVENOUS

## 2012-04-10 MED ORDER — PHENOL 1.4 % MT LIQD: 1.0000 | OROMUCOSAL | Status: DC | PRN

## 2012-04-10 MED ORDER — LACTATED RINGERS IV SOLN
INTRAVENOUS | Status: DC
  Administered 2012-04-10: 13:00:00 via INTRAVENOUS

## 2012-04-10 MED ORDER — HYDROMORPHONE HCL PF 1 MG/ML IJ SOLN
INTRAMUSCULAR | Status: AC
  Administered 2012-04-10: 0.5 mg
  Filled 2012-04-10: qty 1

## 2012-04-10 MED ORDER — DEXAMETHASONE SODIUM PHOSPHATE 4 MG/ML IJ SOLN
4.0000 mg | Freq: Once | INTRAMUSCULAR | Status: DC
  Filled 2012-04-10: qty 1

## 2012-04-10 MED ORDER — ACETAMINOPHEN 10 MG/ML IV SOLN: 1000.0000 mg | Freq: Once | INTRAVENOUS | Status: DC | PRN

## 2012-04-10 MED ORDER — DEXAMETHASONE SODIUM PHOSPHATE 10 MG/ML IJ SOLN
INTRAMUSCULAR | Status: AC
  Administered 2012-04-10: 10 mg via INTRAVENOUS
  Filled 2012-04-10: qty 1

## 2012-04-10 MED ORDER — OXYCODONE HCL 5 MG PO TABS
10.0000 mg | ORAL_TABLET | ORAL | Status: DC | PRN
  Administered 2012-04-11: 10 mg via ORAL
  Filled 2012-04-10: qty 2

## 2012-04-10 MED ORDER — ACETAMINOPHEN 10 MG/ML IV SOLN
1000.0000 mg | Freq: Four times a day (QID) | INTRAVENOUS | Status: DC
  Administered 2012-04-10 – 2012-04-11 (×3): 1000 mg via INTRAVENOUS
  Filled 2012-04-10 (×4): qty 100

## 2012-04-10 MED ORDER — METHOCARBAMOL 100 MG/ML IJ SOLN: 500.0000 mg | Freq: Four times a day (QID) | INTRAMUSCULAR | Status: DC | PRN

## 2012-04-10 MED ORDER — LIDOCAINE HCL (CARDIAC) 20 MG/ML IV SOLN
INTRAVENOUS | Status: DC | PRN
  Administered 2012-04-10: 100 mg via INTRAVENOUS

## 2012-04-10 MED ORDER — DEXAMETHASONE 4 MG PO TABS
4.0000 mg | ORAL_TABLET | Freq: Four times a day (QID) | ORAL | Status: DC
  Administered 2012-04-11: 4 mg via ORAL
  Filled 2012-04-10 (×7): qty 1

## 2012-04-10 MED ORDER — NEOSTIGMINE METHYLSULFATE 1 MG/ML IJ SOLN
INTRAMUSCULAR | Status: DC | PRN
  Administered 2012-04-10: 2 mg via INTRAVENOUS

## 2012-04-10 MED ORDER — BUPIVACAINE-EPINEPHRINE 0.25% -1:200000 IJ SOLN
INTRAMUSCULAR | Status: AC
  Filled 2012-04-10: qty 1

## 2012-04-10 MED ORDER — PHENYLEPHRINE HCL 10 MG/ML IJ SOLN
INTRAMUSCULAR | Status: DC | PRN
  Administered 2012-04-10 (×3): 40 ug via INTRAVENOUS
  Administered 2012-04-10: 80 ug via INTRAVENOUS
  Administered 2012-04-10 (×4): 40 ug via INTRAVENOUS
  Administered 2012-04-10: 80 ug via INTRAVENOUS
  Administered 2012-04-10 (×2): 40 ug via INTRAVENOUS

## 2012-04-10 MED ORDER — MENTHOL 3 MG MT LOZG
1.0000 | LOZENGE | OROMUCOSAL | Status: DC | PRN
  Administered 2012-04-10: 3 mg via ORAL
  Filled 2012-04-10: qty 9

## 2012-04-10 MED ORDER — BUPIVACAINE-EPINEPHRINE 0.25% -1:200000 IJ SOLN
INTRAMUSCULAR | Status: DC | PRN
  Administered 2012-04-10: 7 mL

## 2012-04-10 MED ORDER — LACTATED RINGERS IV SOLN
INTRAVENOUS | Status: DC | PRN
  Administered 2012-04-10 (×2): via INTRAVENOUS

## 2012-04-10 MED ORDER — ROCURONIUM BROMIDE 100 MG/10ML IV SOLN
INTRAVENOUS | Status: DC | PRN
  Administered 2012-04-10: 50 mg via INTRAVENOUS

## 2012-04-10 MED ORDER — LACTATED RINGERS IV SOLN
INTRAVENOUS | Status: DC
  Administered 2012-04-10 – 2012-04-11 (×2): via INTRAVENOUS

## 2012-04-10 MED ORDER — OXYCODONE-ACETAMINOPHEN 10-325 MG PO TABS: 1.0000 | ORAL_TABLET | ORAL | Status: DC | PRN

## 2012-04-10 MED ORDER — SODIUM CHLORIDE 0.9 % IJ SOLN: 3.0000 mL | INTRAMUSCULAR | Status: DC | PRN

## 2012-04-10 MED ORDER — ATORVASTATIN CALCIUM 10 MG PO TABS
10.0000 mg | ORAL_TABLET | Freq: Every day | ORAL | Status: DC
  Administered 2012-04-10 – 2012-04-11 (×2): 10 mg via ORAL
  Filled 2012-04-10 (×2): qty 1

## 2012-04-10 MED ORDER — METHOCARBAMOL 500 MG PO TABS: 500.0000 mg | ORAL_TABLET | Freq: Three times a day (TID) | ORAL | Status: DC | PRN

## 2012-04-10 MED ORDER — FENTANYL CITRATE 0.05 MG/ML IJ SOLN
INTRAMUSCULAR | Status: DC | PRN
  Administered 2012-04-10: 50 ug via INTRAVENOUS
  Administered 2012-04-10 (×3): 100 ug via INTRAVENOUS

## 2012-04-10 MED ORDER — POLYETHYLENE GLYCOL 3350 17 GM/SCOOP PO POWD: 17.0000 g | Freq: Every day | ORAL | Status: DC

## 2012-04-10 MED ORDER — PANTOPRAZOLE SODIUM 40 MG PO TBEC
40.0000 mg | DELAYED_RELEASE_TABLET | Freq: Every day | ORAL | Status: DC
  Administered 2012-04-10 – 2012-04-11 (×2): 40 mg via ORAL
  Filled 2012-04-10 (×2): qty 1

## 2012-04-10 MED ORDER — METHOCARBAMOL 500 MG PO TABS: 500.0000 mg | ORAL_TABLET | Freq: Four times a day (QID) | ORAL | Status: DC | PRN

## 2012-04-10 MED ORDER — HEMOSTATIC AGENTS (NO CHARGE) OPTIME
TOPICAL | Status: DC | PRN
  Administered 2012-04-10: 1 via TOPICAL

## 2012-04-10 MED ORDER — MORPHINE SULFATE 2 MG/ML IJ SOLN
1.0000 mg | INTRAMUSCULAR | Status: DC | PRN
  Administered 2012-04-10 (×2): 2 mg via INTRAVENOUS
  Filled 2012-04-10 (×2): qty 1

## 2012-04-10 MED ORDER — CEFAZOLIN SODIUM 1-5 GM-% IV SOLN
1.0000 g | Freq: Three times a day (TID) | INTRAVENOUS | Status: AC
  Administered 2012-04-10 – 2012-04-11 (×2): 1 g via INTRAVENOUS
  Filled 2012-04-10 (×2): qty 50

## 2012-04-10 MED ORDER — ZOLPIDEM TARTRATE 5 MG PO TABS
5.0000 mg | ORAL_TABLET | Freq: Every evening | ORAL | Status: DC | PRN
  Administered 2012-04-10: 5 mg via ORAL
  Filled 2012-04-10: qty 1

## 2012-04-10 MED ORDER — ONDANSETRON HCL 4 MG/2ML IJ SOLN: 4.0000 mg | INTRAMUSCULAR | Status: DC | PRN

## 2012-04-10 MED ORDER — ONDANSETRON HCL 4 MG/2ML IJ SOLN: 4.0000 mg | Freq: Once | INTRAMUSCULAR | Status: DC | PRN

## 2012-04-10 MED ORDER — SODIUM CHLORIDE 0.9 % IJ SOLN: 3.0000 mL | Freq: Two times a day (BID) | INTRAMUSCULAR | Status: DC

## 2012-04-10 MED ORDER — GLYCOPYRROLATE 0.2 MG/ML IJ SOLN
INTRAMUSCULAR | Status: DC | PRN
  Administered 2012-04-10: 0.2 mg via INTRAVENOUS

## 2012-04-10 MED ORDER — SODIUM CHLORIDE 0.9 % IV SOLN: 250.0000 mL | INTRAVENOUS | Status: DC

## 2012-04-10 MED ORDER — DOCUSATE SODIUM 100 MG PO CAPS: 100.0000 mg | ORAL_CAPSULE | Freq: Three times a day (TID) | ORAL | Status: DC | PRN

## 2012-04-10 MED ORDER — ACETAMINOPHEN 10 MG/ML IV SOLN
1000.0000 mg | Freq: Once | INTRAVENOUS | Status: AC
  Administered 2012-04-10: 1000 mg via INTRAVENOUS
  Filled 2012-04-10: qty 100

## 2012-04-10 MED ORDER — THROMBIN 20000 UNITS EX SOLR
CUTANEOUS | Status: AC
  Filled 2012-04-10: qty 20000

## 2012-04-10 SURGICAL SUPPLY — 59 items
BLADE SURG 15 STRL LF DISP TIS (BLADE) IMPLANT
BLADE SURG 15 STRL SS (BLADE)
BLADE SURG ROTATE 9660 (MISCELLANEOUS) IMPLANT
BUR EGG ELITE 4.0 (BURR) IMPLANT
BUR MATCHSTICK NEURO 3.0 LAGG (BURR) IMPLANT
CANISTER SUCTION 2500CC (MISCELLANEOUS) ×2 IMPLANT
CLOTH BEACON ORANGE TIMEOUT ST (SAFETY) ×2 IMPLANT
CLSR STERI-STRIP ANTIMIC 1/2X4 (GAUZE/BANDAGES/DRESSINGS) ×2 IMPLANT
CORDS BIPOLAR (ELECTRODE) ×2 IMPLANT
COVER SURGICAL LIGHT HANDLE (MISCELLANEOUS) ×4 IMPLANT
CRADLE DONUT ADULT HEAD (MISCELLANEOUS) ×2 IMPLANT
DRAPE C-ARM 42X72 X-RAY (DRAPES) ×2 IMPLANT
DRAPE INCISE IOBAN 66X45 STRL (DRAPES) ×2 IMPLANT
DRAPE POUCH INSTRU U-SHP 10X18 (DRAPES) ×2 IMPLANT
DRAPE SURG 17X23 STRL (DRAPES) ×2 IMPLANT
DRAPE U-SHAPE 47X51 STRL (DRAPES) ×2 IMPLANT
DRSG MEPILEX BORDER 4X4 (GAUZE/BANDAGES/DRESSINGS) ×2 IMPLANT
DURAPREP 26ML APPLICATOR (WOUND CARE) ×2 IMPLANT
ELECT COATED BLADE 2.86 ST (ELECTRODE) ×2 IMPLANT
ELECT REM PT RETURN 9FT ADLT (ELECTROSURGICAL) ×2
ELECTRODE REM PT RTRN 9FT ADLT (ELECTROSURGICAL) ×1 IMPLANT
ENDOSKELETON LG TC 6VBR 8MM (Orthopedic Implant) ×2 IMPLANT
GLOVE BIOGEL PI IND STRL 8.5 (GLOVE) ×1 IMPLANT
GLOVE BIOGEL PI INDICATOR 8.5 (GLOVE) ×1
GLOVE ECLIPSE 8.5 STRL (GLOVE) ×2 IMPLANT
GOWN PREVENTION PLUS XXLARGE (GOWN DISPOSABLE) ×2 IMPLANT
GOWN STRL REIN XL XLG (GOWN DISPOSABLE) ×4 IMPLANT
KIT BASIN OR (CUSTOM PROCEDURE TRAY) ×2 IMPLANT
KIT ROOM TURNOVER OR (KITS) ×2 IMPLANT
NDL SPNL 18GX3.5 QUINCKE PK (NEEDLE) ×1 IMPLANT
NEEDLE SPNL 18GX3.5 QUINCKE PK (NEEDLE) ×2 IMPLANT
NS IRRIG 1000ML POUR BTL (IV SOLUTION) ×3 IMPLANT
PACK ORTHO CERVICAL (CUSTOM PROCEDURE TRAY) ×2 IMPLANT
PACK UNIVERSAL I (CUSTOM PROCEDURE TRAY) ×2 IMPLANT
PAD ARMBOARD 7.5X6 YLW CONV (MISCELLANEOUS) ×4 IMPLANT
PATTIES SURGICAL .25X.25 (GAUZE/BANDAGES/DRESSINGS) ×2 IMPLANT
PIN DISTRACTION 14 (PIN) ×1 IMPLANT
PLATE SKYLINE 2 LEVEL 34MM (Plate) ×1 IMPLANT
PRO DISC C RETAINER PIN 14MM ×2 IMPLANT
PUTTY BONE DBX 5CC MIX (Putty) ×2 IMPLANT
RESTRAINT LIMB HOLDER UNIV (RESTRAINTS) ×2 IMPLANT
SCREW SKYLINE 14MM SD-VA (Screw) ×6 IMPLANT
SPONGE INTESTINAL PEANUT (DISPOSABLE) ×4 IMPLANT
SPONGE LAP 4X18 X RAY DECT (DISPOSABLE) IMPLANT
SPONGE SURGIFOAM ABS GEL 100 (HEMOSTASIS) ×2 IMPLANT
SURGIFLO TRUKIT (HEMOSTASIS) ×2 IMPLANT
SUT BONE WAX W31G (SUTURE) ×1 IMPLANT
SUT MNCRL AB 3-0 PS2 18 (SUTURE) ×2 IMPLANT
SUT SILK 2 0 (SUTURE)
SUT SILK 2-0 18XBRD TIE 12 (SUTURE) IMPLANT
SUT VIC AB 2-0 CT1 18 (SUTURE) ×2 IMPLANT
SYR BULB IRRIGATION 50ML (SYRINGE) ×2 IMPLANT
SYR CONTROL 10ML LL (SYRINGE) ×2 IMPLANT
TAPE CLOTH 4X10 WHT NS (GAUZE/BANDAGES/DRESSINGS) ×2 IMPLANT
TAPE UMBILICAL COTTON 1/8X30 (MISCELLANEOUS) ×2 IMPLANT
TOWEL OR 17X24 6PK STRL BLUE (TOWEL DISPOSABLE) ×2 IMPLANT
TOWEL OR 17X26 10 PK STRL BLUE (TOWEL DISPOSABLE) ×2 IMPLANT
TRAY FOLEY CATH 14FR (SET/KITS/TRAYS/PACK) IMPLANT
WATER STERILE IRR 1000ML POUR (IV SOLUTION) ×1 IMPLANT

## 2012-04-10 NOTE — Progress Notes (Signed)
Complaint of sore thraot headache, and neck is hurting, dr. Ivin Booty at bedside to use pain med pt already had tylenol

## 2012-04-10 NOTE — Brief Op Note (Signed)
04/10/2012  4:33 PM  PATIENT:  Jordan Barton  52 y.o. male  PRE-OPERATIVE DIAGNOSIS:  Cervical Spondylotic Radiculopathy  POST-OPERATIVE DIAGNOSIS:  Cervical Spondylotic Radiculopathy  PROCEDURE:  Procedure(s): ANTERIOR CERVICAL DISCECTOMY FUSION C5 - C7 2 LEVEL (N/A)  SURGEON:  Surgeon(s) and Role:    * Venita Lick, MD - Primary  PHYSICIAN ASSISTANT:   ASSISTANTS: none   ANESTHESIA:   general  EBL:  Total I/O In: 1000 [I.V.:1000] Out: -   BLOOD ADMINISTERED:none  DRAINS: none   LOCAL MEDICATIONS USED:  MARCAINE     SPECIMEN:  No Specimen  DISPOSITION OF SPECIMEN:  N/A  COUNTS:  YES  TOURNIQUET:  * No tourniquets in log *  DICTATION: .Other Dictation: Dictation Number [id number]  PLAN OF CARE: Admit to inpatient   PATIENT DISPOSITION:  PACU - hemodynamically stable.

## 2012-04-10 NOTE — Anesthesia Preprocedure Evaluation (Signed)
Anesthesia Evaluation  Patient identified by MRN, date of birth, ID band Patient awake    Reviewed: Allergy & Precautions, H&P , NPO status , Patient's Chart, lab work & pertinent test results  Airway       Dental  (+) Teeth Intact and Dental Advisory Given   Pulmonary  breath sounds clear to auscultation        Cardiovascular Rhythm:Regular Rate:Normal     Neuro/Psych    GI/Hepatic   Endo/Other    Renal/GU      Musculoskeletal   Abdominal (+) + obese,   Peds  Hematology   Anesthesia Other Findings   Reproductive/Obstetrics                           Anesthesia Physical Anesthesia Plan  ASA: II  Anesthesia Plan: General   Post-op Pain Management:    Induction: Intravenous  Airway Management Planned: Oral ETT  Additional Equipment:   Intra-op Plan:   Post-operative Plan: Extubation in OR  Informed Consent: I have reviewed the patients History and Physical, chart, labs and discussed the procedure including the risks, benefits and alternatives for the proposed anesthesia with the patient or authorized representative who has indicated his/her understanding and acceptance.   Dental advisory given  Plan Discussed with: CRNA and Surgeon  Anesthesia Plan Comments:         Anesthesia Quick Evaluation

## 2012-04-10 NOTE — Transfer of Care (Signed)
Immediate Anesthesia Transfer of Care Note  Patient: Jordan Barton  Procedure(s) Performed: Procedure(s): ANTERIOR CERVICAL DISCECTOMY FUSION C5 - C7 2 LEVEL (N/A)  Patient Location: PACU  Anesthesia Type:General  Level of Consciousness: awake, alert  and oriented  Airway & Oxygen Therapy: Patient Spontanous Breathing and Patient connected to nasal cannula oxygen  Post-op Assessment: Report given to PACU RN, Post -op Vital signs reviewed and stable and Patient moving all extremities X 4  Post vital signs: Reviewed and stable  Complications: No apparent anesthesia complications

## 2012-04-10 NOTE — H&P (Signed)
History of Present Illness The patient is a 52 year old male who presents with neck pain. The patient is seen today in referral from Dr. Geni Bers. The patient reports symptoms involving the entire neck (primary pain is on the left side going down the shoulder and into the arm) which began 3 week(s) ago. The symptoms began without any known injury (Pt. woke up and his neck felt like he had slept wrong. He went to his primary care doctor after 3 days of it constantly hurting and getting worse. ). Symptoms include neck pain, numbness, tingling, upper extremity weakness (left arm) and weakness (left arm.). The patient describes the pain as sharp.The patient describes their symptoms as moderate in severity.The patient does feel that the symptoms are worsening. Current treatment includes muscle relaxants and opioid analgesics. 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 Note for "Neck pain": He was also perscribed Prednisone 20 MG and Tizanidine HCL 4 MG by his primary care doctor but stopped taking them until his first visit here.  Subjective Transcription  Jordan Barton is a very pleasant, active, young gentleman, who was in his usual state of good health without any significant neck or left arm pain until the 28th of January. He simply awoke with significant neck pain which rapidly progressed into significant left arm pain. During the course of the last 4 weeks, he's been to the ER once. He's been to his PCP a few times. He has had 2 Medrol Dosepacks, narcotic medications and muscle relaxers. He states over the last week he's been off his narcotics, taking only Aleve, and the pain is somewhat better than the prior 3 weeks. Overall his quality of life, his ability to function and his ability to return to work have all been hindered.    Allergies Minocin *Tetracyclines**   Family History Cancer. father   Social History Alcohol use. current drinker; drinks  wine; only occasionally per week Children. 2 Current work status. working full time Drug/Alcohol Rehab (Currently). no Drug/Alcohol Rehab (Previously). no Exercise. Exercises weekly; does running / walking Illicit drug use. no Living situation. live with spouse Marital status. married Number of flights of stairs before winded. 4-5 Pain Contract. no Tobacco / smoke exposure. no Tobacco use. former smoker; smoke(d) 1 pack(s) per day   Medication History Lipitor (10MG  Tablet, Oral) Active. NexIUM (5MG  Packet, Oral) Active. Ambien (10MG  Tablet, Oral) Active.   Past Surgical History Colon Polyp Removal - Colonoscopy Straighten Nasal Septum   Other Problems Anxiety Disorder   Review of Systems General:Not Present- Chills, Fever, Night Sweats, Appetite Loss, Fatigue, Feeling sick, Weight Gain and Weight Loss. Skin:Not Present- Itching, Rash, Skin Color Changes, Ulcer, Psoriasis and Change in Hair or Nails. HEENT:Not Present- Sensitivity to light, Hearing problems, Nose Bleed and Ringing in the Ears. Neck:Not Present- Swollen Glands and Neck Mass. Respiratory:Not Present- Snoring, Chronic Cough, Bloody sputum and Dyspnea. Cardiovascular:Not Present- Shortness of Breath, Chest Pain, Swelling of Extremities, Leg Cramps and Palpitations. Gastrointestinal:Not Present- Bloody Stool, Heartburn, Abdominal Pain, Vomiting, Nausea and Incontinence of Stool. Male Genitourinary:Not Present- Blood in Urine, Frequency, Incontinence and Nocturia. Musculoskeletal:Present- Muscle Pain and Back Pain. Not Present- Muscle Weakness, Joint Stiffness, Joint Swelling and Joint Pain. Neurological:Present- Numbness and Burning. Not Present- Tingling, Tremor, Headaches and Dizziness. Psychiatric:Not Present- Anxiety, Depression and Memory Loss. Endocrine:Not Present- Cold Intolerance, Heat Intolerance, Excessive hunger and Excessive Thirst. Hematology:Not Present- Abnormal  Bleeding, Anemia, Blood Clots and Easy Bruising.   Vitals 03/21/2012 8:23 AM Weight: 255  lb Height: 77 in Body Surface Area: 2.51 m Body Mass Index: 30.24 kg/m Pulse: 71 (Regular) BP: 135/81 (Sitting, Left Arm, Standard)    Objective Transcription  On clinical exam, he's a pleasant man, who appears younger than his stated age. He is alert. He's oriented times 3. He has very limited cervical spine ROM due to pain. Positive Lhermitte's sign on the left side. Positive Spurling's sign on the left side. Reduction in pain with the head held overtop his head. No shortness of breath or chest pain. The abdomen is soft and nontender. No history of incontinence or bowel or bladder. Normal gait pattern. Negative Hoffman's sign. Negative Babinski sign. No clonus. He has 1+ DTRs throughout in both extremities bilaterally. He has 4/5 biceps, wrist extensor and triceps strength on the left. He has 5/5 deltoid and grip strength on the left. The right side is 5/5 throughout. He has sensory changes just in the proximal aspect of the arm. No significant numbness or dysesthesias below the level of the elbow. He has no neck swelling or palpable mass.  Repeat Evaluation:  The patient is a pleasant gentleman who appears his stated age in no acute distress. He is alert and oriented times three.  On examination, he has significant pain with cervical spine range of motion. Positive Lhermitte sign on the left side. Positive Spurling sign on the left side. He has reduced pain again with the arm placed overhead. He has four out of five biceps, wrist extensor and triceps strength on the left, deltoid is five out of five, right side has five out of five strength throughout. Diminished sensation predominately in the C6 and C7 dermatome. He has one plus symmetrical deep tendon reflexes throughout. Negative Babinski test. Negative Hoffmann sign. Negative clonus.  Respiratory, no shortness of  breath or chest pain.  Abdomen, soft and nontender.  Imaging: He had an MRI of his thoracic and lumbar spine March 03, 2012. He did have significant motion degradation and motion artifact but there is disc protrusion, left side foraminal compromise at C5-6 and 6-7 and, to a lesser degree, 3-4 and 4-5. There is no apparent cord signal changes, no other soft tissue abnormalities that I can see. The official radiology report is still not yet available. It is being faxed to me.   Two level cervical disk pathology C5-6, C6-7 with posterolateral to the left hard disk osteophyte causing nerve compression and foraminal compromise. There is no cord signal change.   Plans Transcription  At this point in time, I am concerned about his C7 and C6 weakness. The progression of his symptoms is characteristic of nerve root compression. His clinical exam and MRI both are consistent with C6 and C7 neuropathic radiculopathy; however, at this point we have talked about injection therapy and surgical intervention as well as P.T. Given the weakness that he has, I am recommending that we consider surgical decompression. It has already been 4 weeks. He has not made significant improvement with respect to his neurological function. While his pain is slowly improving, we have actually seen a deterioration in his strength which would indicate a progressive neurological deficit. At this point, because there is significant motion degradation and motion artifact on his current MRI, I am going to recommend that we get a new MRI at [facility] so that we can get a better picture of his cervical spine pathology. I am also going to give him a prescription for Valium and Norco. He's had Percocet in the past  but it caused a generalized heightened anxiety for him. I will see him back once he has the new MRI and we can determine in greater detail the specific surgery, but at this point I think the most likely option  is going to be a 2-level anterior cervical discectomy and fusion. In order to expedite this, we will get preoperative medical clearance from Dr. Dareen Piano. 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   At this point in time, I had a long discussion with the patient and his wife. They were present for my dictation. I have indicated to the patient that he has signs and symptoms consistent with cervical radiculopathy in the C6 and C7 distribution. In order to address this, we could use injection therapy and physical therapy or surgical intervention. We reviewed the risks of surgery which include infection, bleeding, nerve damage, death, stroke, paralysis, failure to heal, need for further surgery, ongoing or worse pain, loss of bowel and bladder control, throat pain, swallowing difficulty, need for posterior surgery, nonunion and adjacent segment disease. They have all expressed an understanding of the risks.   Plans Transcription  Given the severity of the patient's radiculopathy (neurological deficit) and his loss in quality of life he would like to proceed with surgery. Given the fact that it has been almost six weeks and he is not improving I think it is reasonable to proceed. We will go ahead and set up the patient for a two-level anterior cervical diskectomy and fusion C5-6, C6-7 for the near future.

## 2012-04-10 NOTE — Anesthesia Postprocedure Evaluation (Signed)
  Anesthesia Post-op Note  Patient: Jordan Barton  Procedure(s) Performed: Procedure(s): ANTERIOR CERVICAL DISCECTOMY FUSION C5 - C7 2 LEVEL (N/A)  Patient Location: PACU  Anesthesia Type:General  Level of Consciousness: awake, alert  and oriented  Airway and Oxygen Therapy: Patient Spontanous Breathing and Patient connected to nasal cannula oxygen  Post-op Pain: mild  Post-op Assessment: Post-op Vital signs reviewed, Patient's Cardiovascular Status Stable, Respiratory Function Stable, Patent Airway and Pain level controlled  Post-op Vital Signs: stable  Complications: No apparent anesthesia complications

## 2012-04-10 NOTE — Preoperative (Signed)
Beta Blockers   Reason not to administer Beta Blockers:Not Applicable 

## 2012-04-11 MED ORDER — HYDROCODONE-ACETAMINOPHEN 10-325 MG PO TABS: 1.0000 | ORAL_TABLET | Freq: Four times a day (QID) | ORAL | Status: DC | PRN

## 2012-04-11 MED ORDER — HYDROCODONE-ACETAMINOPHEN 10-325 MG PO TABS
1.0000 | ORAL_TABLET | Freq: Four times a day (QID) | ORAL | Status: DC | PRN
  Administered 2012-04-11: 2 via ORAL
  Filled 2012-04-11: qty 2

## 2012-04-11 NOTE — Evaluation (Signed)
Physical Therapy Evaluation Patient Details Name: Jordan Barton MRN: [id number] DOB: 1960/10/28 Today's Date: 04/11/2012 Time: 4098-1191 PT Time Calculation (min): 42 min  PT Assessment / Plan / Recommendation Clinical Impression  Pt is a 52 y/o male s/p 2 level anterior cervical fusion.  Pt demonstrates independence with funcitonal mobility.  Extended time spent educating pt and spouse on rehab process and cervical precautions including proper wearing of Cervical brace.      PT Assessment  Patent does not need any further PT services    Follow Up Recommendations  No PT follow up    Does the patient have the potential to tolerate intense rehabilitation      Barriers to Discharge        Equipment Recommendations  None recommended by PT    Recommendations for Other Services     Frequency      Precautions / Restrictions Precautions Precautions: Cervical Precaution Comments: Educated pt on cervical precautions and use of Aspen Collar.  Required Braces or Orthoses: Cervical Brace Cervical Brace: Hard collar;Applied in sitting position Restrictions Weight Bearing Restrictions: No   Pertinent Vitals/Pain Pt c/o mild pain where c-collar meets back of pt's head Adjusted collar and pain resolved.        Mobility  Bed Mobility Bed Mobility: Rolling Right;Right Sidelying to Sit;Sit to Sidelying Right Rolling Right: 5: Supervision;6: Modified independent (Device/Increase time) Right Sidelying to Sit: 6: Modified independent (Device/Increase time);5: Supervision Sit to Sidelying Right: 6: Modified independent (Device/Increase time);5: Supervision;HOB flat Details for Bed Mobility Assistance: Pt demonstrating proper technique to maintain cervical precautions.   Transfers Transfers: Sit to Stand;Stand to Sit Sit to Stand: 7: Independent Stand to Sit: 7: Independent Ambulation/Gait Ambulation/Gait Assistance: 7: Independent Ambulation Distance (Feet): 200 Feet Assistive  device: None Gait Pattern: Within Functional Limits Stairs: Yes Stairs Assistance: 7: Independent Stair Management Technique: One rail Left;Forwards Number of Stairs: 6 Wheelchair Mobility Wheelchair Mobility: No    Exercises     PT Diagnosis:    PT Problem List:   PT Treatment Interventions:     PT Goals Acute Rehab PT Goals PT Goal Formulation: With patient  Visit Information  Last PT Received On: 04/11/12 Assistance Needed: +1    Subjective Data  Subjective: Agree to PT eval.   Prior Functioning  Home Living Lives With: Spouse;Family Available Help at Discharge: Family;Available 24 hours/day Type of Home: House Home Access: Stairs to enter [employer] of Steps: 2 Entrance Stairs-Rails: None Home Layout: Two level;Bed/bath upstairs Alternate Level Stairs-Number of Steps: 16 Alternate Level Stairs-Rails: Right;Left;Can reach both Bathroom Shower/Tub: Walk-in shower;Tub only;Door [employer] Toilet: Handicapped height Bathroom Accessibility: Yes Home Adaptive Equipment: None Prior Function Level of Independence: Independent Able to Take Stairs?: Yes Driving: Yes Vocation: Full time employment Communication Communication: No difficulties    Cognition  Cognition Overall Cognitive Status: Appears within functional limits for tasks assessed/performed Arousal/Alertness: Awake/alert Orientation Level: Appears intact for tasks assessed Behavior During Session: [facility] for tasks performed    Extremity/Trunk Assessment Right Upper Extremity Assessment RUE ROM/Strength/Tone: Within functional levels Left Upper Extremity Assessment LUE ROM/Strength/Tone: Within functional levels Right Lower Extremity Assessment RLE ROM/Strength/Tone: Within functional levels Left Lower Extremity Assessment LLE ROM/Strength/Tone: Within functional levels   Balance Balance Balance Assessed: No  End of Session PT - End of Session Equipment Utilized During Treatment: Gait  belt;Cervical collar Activity Tolerance: Patient tolerated treatment well Patient left: in chair;with call bell/phone within reach;with family/visitor present Nurse Communication: Mobility status  GP  Jordan Barton 04/11/2012, 2:16 PM Jordan Barton L. Anani Gu DPT [phone]

## 2012-04-11 NOTE — Discharge Summary (Signed)
Patient ID: Jordan Barton MRN: [id number] DOB/AGE: 1960/10/23 52 y.o.  Admit date: 04/10/2012 Discharge date: 04/11/2012  Admission Diagnoses:  Principal Problem:   Cervical spondylosis   Discharge Diagnoses:  Principal Problem:   Cervical spondylosis  status post Procedure(s): ANTERIOR CERVICAL DISCECTOMY FUSION C5 - C7 2 LEVEL  Past Medical History  Diagnosis Date  . Hyperlipemia     sees Elizabeth Palau, NP [phone]  . GERD (gastroesophageal reflux disease)   . Anxiety   . Headache     hx of  . Chronic neck pain     hx of  . Glaucoma     seen q 6months by Dr. Hazle Quant    Surgeries: Procedure(s): ANTERIOR CERVICAL DISCECTOMY FUSION C5 - C7 2 LEVEL on 04/10/2012   Consultants:  none  Discharged Condition: Improved  Hospital Course: Jordan Barton is an 52 y.o. male who was admitted 04/10/2012 for operative treatment of Cervical spondylosis. Patient failed conservative treatments (please see the history and physical for the specifics) and had severe unremitting pain that affects sleep, daily activities and work/hobbies. After pre-op clearance, the patient was taken to the operating room on 04/10/2012 and underwent  Procedure(s): ANTERIOR CERVICAL DISCECTOMY FUSION C5 - C7 2 LEVEL.    Patient was given perioperative antibiotics: Anti-infectives   Start     Dose/Rate Route Frequency Ordered Stop   04/10/12 2000  ceFAZolin (ANCEF) IVPB 1 g/50 mL premix     1 g 100 mL/hr over 30 Minutes Intravenous Every 8 hours 04/10/12 1940 04/11/12 0655   04/10/12 1051  ceFAZolin (ANCEF) IVPB 2 g/50 mL premix     2 g 100 mL/hr over 30 Minutes Intravenous 30 min pre-op 04/10/12 1051 04/10/12 1315       Patient was given sequential compression devices and early ambulation to prevent DVT.   Patient benefited maximally from hospital stay and there were no complications. At the time of discharge, the patient was urinating/moving their bowels without difficulty, tolerating a  regular diet, pain is controlled with oral pain medications and they have been cleared by PT/OT.   Recent vital signs: Patient Vitals for the past 24 hrs:  BP Temp Temp src Pulse Resp SpO2  04/11/12 0500 130/78 mmHg 98.7 F (37.1 C) - 85 18 100 %  04/10/12 2250 127/74 mmHg 98.2 F (36.8 C) - 88 18 100 %  04/10/12 1854 119/81 mmHg 98.2 F (36.8 C) - 87 17 99 %  04/10/12 1830 132/75 mmHg 98.6 F (37 C) - 83 12 100 %  04/10/12 1815 137/75 mmHg - - 84 12 100 %  04/10/12 1800 147/71 mmHg - - 76 8 100 %  04/10/12 1745 139/81 mmHg - - 79 9 100 %  04/10/12 1730 132/86 mmHg - - 78 16 100 %  04/10/12 1718 132/76 mmHg - - 81 13 100 %  04/10/12 1715 - - - 76 9 95 %  04/10/12 1703 141/81 mmHg 97.7 F (36.5 C) - 79 14 97 %  04/10/12 1048 115/77 mmHg 97.7 F (36.5 C) Oral 77 20 98 %     Recent laboratory studies: No results found for this basename: WBC, HGB, HCT, PLT, NA, K, CL, CO2, BUN, CREATININE, GLUCOSE, PT, INR, CALCIUM, 2,  in the last 72 hours   Discharge Medications:     Medication List    STOP taking these medications       diazepam 5 MG tablet  Commonly known as:  VALIUM     HYDROcodone-acetaminophen  5-325 MG per tablet  Commonly known as:  NORCO/VICODIN      TAKE these medications       atorvastatin 10 MG tablet  Commonly known as:  LIPITOR  Take 10 mg by mouth daily.     docusate sodium 100 MG capsule  Commonly known as:  COLACE  Take 1 capsule (100 mg total) by mouth 3 (three) times daily as needed for constipation.     esomeprazole 20 MG capsule  Commonly known as:  NEXIUM  Take 20 mg by mouth daily as needed (for acid reflux).     methocarbamol 500 MG tablet  Commonly known as:  ROBAXIN  Take 1 tablet (500 mg total) by mouth 3 (three) times daily as needed.     ondansetron 4 MG tablet  Commonly known as:  ZOFRAN  Take 1 tablet (4 mg total) by mouth every 8 (eight) hours as needed for nausea.     oxyCODONE-acetaminophen 10-325 MG per tablet  Commonly  known as:  PERCOCET  Take 1 tablet by mouth every 4 (four) hours as needed for pain.     polyethylene glycol powder powder  Commonly known as:  GLYCOLAX  Take 17 g by mouth daily.     zolpidem 10 MG tablet  Commonly known as:  AMBIEN  Take 10 mg by mouth at bedtime as needed for sleep.        Diagnostic Studies: Dg Cervical Spine 2 Or 3 Views  04/10/2012  *RADIOLOGY REPORT*  Clinical Data: Postop ACDF C5-6 and C6-7.  PORTABLE CERVICAL SPINE - 2-3 VIEW 04/10/2012 1727 hours:  Comparison: Cervical spine MRI 03/22/2012 and cervical spine x-rays earlier same day 1108 hours.  Findings: ACDF with hardware and interbody fusion with plugs at C5- 6 and C6-7.  I am unable to visualize the C6-7 level on the portable cross-table lateral examination.  Alignment appears anatomic through the upper C6 level.  IMPRESSION: Anatomic alignment through the upper C6 level post ACDF with hardware at C5-6 and C6-7.  C6-7 was not visible on the portable cross-table lateral image.   Original Report Authenticated By: Hulan Saas, M.D.    Dg Cervical Spine 2 Or 3 Views  04/10/2012  *RADIOLOGY REPORT*  Clinical Data: Preoperative for cervical spine surgery. Localization.  CERVICAL SPINE - 2-3 VIEW  Comparison: 03/22/2012 MRI  Findings: Mild loss of disc height noted at C4-5, with slight posterior spurring at the C3-4, C4-5, C5-6, and likely C6-7 levels. No malalignment.  No prevertebral soft tissue swelling.  IMPRESSION:  1.  Cervical spondylosis noted.   Original Report Authenticated By: Gaylyn Rong, M.D.    Mr Cervical Spine Wo Contrast  03/22/2012  *RADIOLOGY REPORT*  Clinical Data: Neck pain extending down the left arm  MRI CERVICAL SPINE WITHOUT CONTRAST  Technique:  Multiplanar and multiecho pulse sequences of the cervical spine, to include the craniocervical junction and cervicothoracic junction, were obtained according to standard protocol without intravenous contrast.  Comparison: None.  Findings: The  foramen magnum is widely patent.  C1-2 is normal.  C2- 3 shows a small central disc protrusion that indents the thecal sac slightly but does not effect the cord or show foraminal extension.  C3-4:  Bulging of the disc with uncovertebral prominence on the right.  Moderate foraminal narrowing on the right without definite neural compression.  C4-5:  Bulging of the disc.  Mild uncovertebral prominence bilaterally.  Moderate foraminal narrowing bilaterally, left more than right.  There be some potential to affect the  left C5 nerve root.  C5-6:  Right-sided predominant spondylosis with endplate osteophytes and bulging of the disc.  Moderate foraminal stenosis on the right and mild foraminal narrowing on the left.  The right C6 nerve root could be affected.  C6-7:  Endplate osteophytes and broad-based disc herniation more prominent towards the left.  No compression of the cord.  Foraminal encroachment on the left quite likely to compress the left C7 nerve root.  C7-T1:  Mild facet degeneration.  No stenosis.  No abnormal cord signal.  IMPRESSION: Left posterolateral predominant spondylosis and disc herniation at C6-7 with foraminal narrowing on the left quite likely to compress the C7 nerve root.  Spondylosis at C5-6 with foraminal narrowing right worse than left. The right C6 nerve root could be affected.  Spondylosis at C4-5.  Foraminal narrowing left worse than right. Some potential to affect the left C5 nerve root.  Right side, spondylosis at C3-4 with moderate foraminal narrowing on the right.   Original Report Authenticated By: Paulina Fusi, M.D.           Follow-up Information   Follow up with Alvy Beal, MD In 2 weeks. (As needed if symptoms worsen)    Contact information:   [address], [address] [place] [address] [phone]       Discharge Plan:  discharge to home  Disposition: stable    Signed: Venita Lick D for Dr. Venita Lick [facility] [phone] 04/11/2012, 7:53 AM

## 2012-04-11 NOTE — Evaluation (Signed)
Occupational Therapy Evaluation Patient Details Name: Jordan Barton MRN: [id number] DOB: Oct 14, 1960 Today's Date: 04/11/2012 Time: 4098-1191 OT Time Calculation (min): 39 min  OT Assessment / Plan / Recommendation Clinical Impression    Pt is a 52 y/o male s/p 2 level anterior cervical fusion.  Pt is able to perform all BADLs with supervision (due to IV) - independent level.  All education completed.  No further acute OT needs identified     OT Assessment  Patient does not need any further OT services    Follow Up Recommendations  No OT follow up (Pt would benefit from ergonomics evaluation for work)    Barriers to Discharge      Equipment Recommendations  None recommended by OT    Recommendations for Other Services    Frequency       Precautions / Restrictions Precautions Precautions: Cervical Precaution Comments: Educated pt on cervical precautions and use of Aspen Collar.  Required Braces or Orthoses: Cervical Brace Cervical Brace: Hard collar;Applied in sitting position Restrictions Weight Bearing Restrictions: No       ADL  Eating/Feeding: Independent Where Assessed - Eating/Feeding: Edge of bed;Chair Grooming: Wash/dry hands;Wash/dry face;Teeth care;Supervision/safety (instructed in proper technique to avoid neck flex/ext) Where Assessed - Grooming: Unsupported standing Upper Body Bathing: Set up Where Assessed - Upper Body Bathing: Unsupported sitting Lower Body Bathing: Supervision/safety Where Assessed - Lower Body Bathing: Unsupported sit to stand Upper Body Dressing: Set up Where Assessed - Upper Body Dressing: Unsupported sitting Lower Body Dressing: Supervision/safety Where Assessed - Lower Body Dressing: Unsupported sit to stand Toilet Transfer: Supervision/safety Toilet Transfer Method: Sit to Barista: Comfort height toilet Toileting - Architect and Hygiene: Supervision/safety Where Assessed - Medical sales representative and Hygiene: Standing Tub/Shower Transfer: Therapist, sports Method: Science writer: Walk in Scientist, research (physical sciences) Used: Other (comment) (cervical collar) Transfers/Ambulation Related to ADLs: Pt ambulates with supervision - independent ADL Comments: Pt. able to access feet without bending forward.  Reviewed neck precautions.  Pt and spouse report they have been donning/doffing brace and don't feel they need to practice further.  Discussed ergonomics with computer use, and safety with grooming - techniques to prevent/limit neck flex/ext    OT Diagnosis:    OT Problem List:   OT Treatment Interventions:     OT Goals    Visit Information  Last OT Received On: 04/11/12 Assistance Needed: +1    Subjective Data  Subjective: "It's a lot better"  re: Lt. UE Patient Stated Goal: To get back to normal   Prior Functioning     Home Living Lives With: Spouse;Family Available Help at Discharge: Family;Available 24 hours/day Type of Home: House Home Access: Stairs to enter [employer] of Steps: 2 Entrance Stairs-Rails: None Home Layout: Two level;Bed/bath upstairs Alternate Level Stairs-Number of Steps: 16 Alternate Level Stairs-Rails: Right;Left;Can reach both Bathroom Shower/Tub: Walk-in shower;Tub only;Door [employer] Toilet: Handicapped height Bathroom Accessibility: Yes Home Adaptive Equipment: None Prior Function Level of Independence: Independent Able to Take Stairs?: Yes Driving: Yes Vocation: Full time employment Communication Communication: No difficulties Dominant Hand: Right         Vision/Perception     Cognition  Cognition Overall Cognitive Status: Appears within functional limits for tasks assessed/performed Arousal/Alertness: Awake/alert Orientation Level: Appears intact for tasks assessed Behavior During Session: [facility] for tasks performed    Extremity/Trunk Assessment Right Upper Extremity  Assessment RUE ROM/Strength/Tone: Within functional levels RUE Sensation: WFL - Light Touch RUE Coordination: WFL - gross/fine  motor (Pt. with a slight lag with little finger movement) Left Upper Extremity Assessment LUE ROM/Strength/Tone: Within functional levels LUE Sensation: WFL - Light Touch LUE Coordination: WFL - gross/fine motor Right Lower Extremity Assessment RLE ROM/Strength/Tone: Within functional levels Left Lower Extremity Assessment LLE ROM/Strength/Tone: Within functional levels     Mobility Bed Mobility Bed Mobility: Rolling Right;Right Sidelying to Sit;Sit to Sidelying Right Rolling Right: 5: Supervision;6: Modified independent (Device/Increase time) Right Sidelying to Sit: 6: Modified independent (Device/Increase time);5: Supervision Sit to Sidelying Right: 6: Modified independent (Device/Increase time);5: Supervision;HOB flat Details for Bed Mobility Assistance: Pt demonstrating proper technique to maintain cervical precautions.   Transfers Sit to Stand: 7: Independent Stand to Sit: 7: Independent     Exercise     Balance Balance Balance Assessed: No   End of Session OT - End of Session Equipment Utilized During Treatment: Cervical collar Activity Tolerance: Patient tolerated treatment well Patient left: in bed;with call bell/phone within reach;with family/visitor present  GO     Makynna Manocchio, Ursula Alert M 04/11/2012, 2:52 PM

## 2012-04-11 NOTE — Progress Notes (Signed)
    Subjective: Procedure(s) (LRB): ANTERIOR CERVICAL DISCECTOMY FUSION C5 - C7 2 LEVEL (N/A) 1 Day Post-Op  Patient reports pain as 2 on 0-10 scale.  Reports decreased arm pain reports incisional neck pain   Positive void Negative bowel movement Positive flatus Negative chest pain or shortness of breath  Objective: Vital signs in last 24 hours: Temp:  [97.7 F (36.5 C)-98.7 F (37.1 C)] 98.7 F (37.1 C) (03/18 0500) Pulse Rate:  [76-88] 85 (03/18 0500) Resp:  [8-20] 18 (03/18 0500) BP: (115-147)/(71-86) 130/78 mmHg (03/18 0500) SpO2:  [95 %-100 %] 100 % (03/18 0500)  Intake/Output from previous day: 03/17 0701 - 03/18 0700 In: 2473.3 [I.V.:2323.3; IV Piggyback:150] Out: 1050 [Urine:800; Blood:250]  Labs: No results found for this basename: WBC, RBC, HCT, PLT,  in the last 72 hours No results found for this basename: NA, K, CL, CO2, BUN, CREATININE, GLUCOSE, CALCIUM,  in the last 72 hours No results found for this basename: LABPT, INR,  in the last 72 hours  Physical Exam: Neurologically intact ABD soft Neurovascular intact Intact pulses distally Incision: dressing C/D/I Compartment soft  Assessment/Plan: Patient stable  xrays satisfactory - unable to see C7 Mobilization with physical therapy Encourage incentive spirometry Continue care  Advance diet Up with therapy D/C IV fluids Plan on D/c this afternoon  Venita Lick, MD [facility] [phone]

## 2012-04-11 NOTE — Op Note (Signed)
NAME:  Jordan Barton, Jordan Barton NO.:  [id number]  MEDICAL RECORD NO.:  [id number]  LOCATION:  [id number]                        FACILITY:  [facility]  PHYSICIAN:  Alvy Beal, MD    DATE OF BIRTH:  11-01-1960  DATE OF PROCEDURE: DATE OF DISCHARGE:                              OPERATIVE REPORT   PREOPERATIVE DIAGNOSIS:  Two-level cervical spondylitic radiculopathy.  POSTOPERATIVE DIAGNOSIS:  Two-level cervical spondylitic radiculopathy.  OPERATIVE PROCEDURE:  Anterior cervical diskectomy and fusion C5-6, C6- 7.  COMPLICATIONS:  None.  CONDITION:  Stable.  INSTRUMENTATION SYSTEM USED:  Titan titanium intervertebral cages size 8 large lordotic both packed with DBX mix with a 34 anterior cervical DePuy Skyline line plate affixed with 14-mm locking screws.  COMPLICATIONS:  No complications.  HISTORY:  This is a very pleasant 52 year old woman who complains of severe neck and radicular left arm pain.  Preoperatively he had C6-C7 radiculopathy with numbness and dysesthesias and triceps weakness.  As a result of the progressive nature of his symptoms and failure to improve with conservative management, he elected to proceed with surgery.  All appropriate risks, benefits, and alternatives were discussed with the patient and consent was obtained.  OPERATIVE NOTE:  The patient was brought to the operating room, placed supine on the operating table.  After successful induction of general anesthesia and endotracheal intubation, TEDs, SCDs were applied.  Rolled towels were placed between the shoulder blades and the neck was prepped and draped in a standard fashion.  Appropriate time-out was then done to confirm patient, procedure, and all other pertinent important data. Once this was done, fluoro was brought into the surgical field sterilely and we identified the C6 vertebral body.  I then infiltrated the proposed incision site, which was transverse incision centered over C6. I  made a midline incision and carried it out to the left and I sharply dissected to the deep fascia.  I incised this and then identified the platysma and dissected through and divided the platysma.  This was a standard Katrinka Blazing and Roxan Hockey approach to the anterior cervical spine.  I then identified the medial border of the sternocleidomastoid and began dissecting along in the inner nervous plane until I identified the omohyoid.  Once I had the omohyoid identified, I dissected it free and then sacrificed this for better retraction.  Once this was retracted, I could then bluntly dissected through the deep cervical and prevertebral fascia until I could palpate the anterior cervical spine.  Self- retaining retractors were placed and a tonsil retractor was placed to retract the esophagus and trachea, and I used Kittner dissector to mobilize the remaining fascia to truly expose the anterior longitudinal ligament.  I then placed a needle into the 5-6 disk space, took an intraoperative x-ray, and confirmed that I was there.  I then used bipolar electrocautery to mobilize the longus colli muscles from the midbody of C5 to the midbody of C7 bilaterally.  Once this was done, I then placed the self-retaining retractor, Caspar blades underneath the longus colli muscles, deflated the endotracheal cuff, expanded them to the appropriate width, and then began diskectomy.  I irrigated the wound copiously with normal saline, and then incised the annulus  with a 15- blade scalpel.  Using a combination of pituitary rongeurs, curettes, and Kerrison rongeurs, I removed the majority of the disk material.  I then removed the overhanging inferior osteophyte from the C6 inferior body so I can truly expose the native disk space.  I then placed distraction pins into the body of C6 and C7, distracted the intervertebral space and used remaining pituitary rongeurs, curettes, and Kerrison rongeurs to remove all of the disk.  I  then developed a plane underneath the posterior longitudinal ligament and resected the posterior longitudinal ligament with a 1 mm Kerrison.  Using a nerve hook, I was able to tease out 4 good sized fragments from the posterolateral left corner consistent with preoperative images.  This allowed for adequate decompression.  I then was able to get underneath the uncovertebral joint on that left side, and debulk the osteophytes.  At this point, I rasped the endplates and I checked began sweeping underneath the endplate and uncovertebral joints with a nerve hook to ensure I had adequate decompression.  Once this was complete and I had bleeding subchondral bone, I trialed intervertebral spacers and elected to use the 8 large spacer.  This was packed with DBX and malleted to the appropriate depth.  Once this was properly seated, I then removed the distraction pin from C7 and placed into the body of C5, distracted the C5-6 disk space, repositioned my retractors, and using the same technique performed a diskectomy at C5-6.  Once I had a complete decompression with resection of the posterior longitudinal ligament and I had undercut the uncovertebral joints, I rasped the endplates and placed the same size intervertebral Titan spacer at this level.  I then removed all the retracting devices, contoured a 36 mm anterior plate, and secured it to the bodies of C5 and C7 with 14 mm locking screws.  X- rays demonstrated satisfactory position of the plate and hardware.  I then placed the C6 screws and then locked all of the devices according to the manufacturer's standard.  I then copiously irrigated with normal saline, used bipolar electrocautery and FloSeal to obtain and maintain hemostasis.  Upon closure, there was no active bleeding and was dry surgical field.  I closed the platysma with interrupted 2-0 Vicryl sutures, and skin with 3-0 Monocryl.  Steri-Strips, dry dressing, and Aspen collar were  applied.  The patient was ultimately extubated, transferred to the PACU without incident.  At the end of the case, all needle and sponge counts were correct.  There was no adverse intraoperative events.     Alvy Beal, MD     DDB/MEDQ  D:  04/10/2012  T:  04/11/2012  Job:  [id number]

## 2012-04-11 NOTE — Progress Notes (Signed)
UR COMPLETED  

## 2012-04-11 NOTE — Care Management Note (Signed)
CARE MANAGEMENT NOTE 04/11/2012  Patient:  Jordan Barton, Jordan Barton   Account Number:  [id number]  Date Initiated:  04/11/2012  Documentation initiated by:  Vance Peper  Subjective/Objective Assessment:   52 yr old male s/p C5-6,6-7 ACDF.     Action/Plan:   Patient has no HH needs.   Anticipated DC Date:  04/11/2012   Anticipated DC Plan:  HOME/SELF CARE      DC Planning Services  CM consult      Choice offered to / List presented to:             Status of service:  Completed, signed off  Discharge Disposition:  HOME/SELF CARE    Comments:  04/11/12 2:45pm Vance Peper, RN BSN Case Manager Patient and wife had questions concerning [employer] coverage. States they were informed that he had to stay as patient for two nights. CM contacted [employer] [phone] (wouldnt release last name), who stated that patient has [employer] Standard and does not have to stay two nights. If patient is medically stable and ready for discharge he is free to go .Dr. Shon Baton  wrote patient's discharge this am, patient can go home.

## 2012-04-12 ENCOUNTER — Encounter (HOSPITAL_COMMUNITY): Payer: Self-pay | Admitting: Orthopedic Surgery

## 2012-04-12 NOTE — Progress Notes (Signed)
Late Entry: PT is recommending home with Home with self care and not SNF. Clinical Social Worker will sign off for now as social work intervention is no longer needed. Please consult us again if new need arises.   Jordan Barton, MSW [phone] 

## 2012-05-22 ENCOUNTER — Ambulatory Visit (INDEPENDENT_AMBULATORY_CARE_PROVIDER_SITE_OTHER)

## 2012-05-22 ENCOUNTER — Other Ambulatory Visit: Payer: Self-pay | Admitting: Orthopedic Surgery

## 2012-05-22 DIAGNOSIS — Z981 Arthrodesis status: Secondary | ICD-10-CM

## 2012-05-22 DIAGNOSIS — M4322 Fusion of spine, cervical region: Secondary | ICD-10-CM

## 2012-05-22 DIAGNOSIS — Z09 Encounter for follow-up examination after completed treatment for conditions other than malignant neoplasm: Secondary | ICD-10-CM

## 2012-05-30 ENCOUNTER — Emergency Department (HOSPITAL_BASED_OUTPATIENT_CLINIC_OR_DEPARTMENT_OTHER)

## 2012-05-30 ENCOUNTER — Emergency Department (HOSPITAL_BASED_OUTPATIENT_CLINIC_OR_DEPARTMENT_OTHER)
Admission: EM | Admit: 2012-05-30 | Discharge: 2012-05-30 | Disposition: A | Discharge status: Home / Self Care | Attending: Emergency Medicine | Admitting: Emergency Medicine

## 2012-05-30 ENCOUNTER — Encounter (HOSPITAL_BASED_OUTPATIENT_CLINIC_OR_DEPARTMENT_OTHER): Payer: Self-pay | Admitting: Emergency Medicine

## 2012-05-30 DIAGNOSIS — R209 Unspecified disturbances of skin sensation: Secondary | ICD-10-CM | POA: Insufficient documentation

## 2012-05-30 DIAGNOSIS — F419 Anxiety disorder, unspecified: Secondary | ICD-10-CM

## 2012-05-30 DIAGNOSIS — Z8739 Personal history of other diseases of the musculoskeletal system and connective tissue: Secondary | ICD-10-CM | POA: Insufficient documentation

## 2012-05-30 DIAGNOSIS — R5381 Other malaise: Secondary | ICD-10-CM | POA: Insufficient documentation

## 2012-05-30 DIAGNOSIS — E785 Hyperlipidemia, unspecified: Secondary | ICD-10-CM | POA: Insufficient documentation

## 2012-05-30 DIAGNOSIS — R110 Nausea: Secondary | ICD-10-CM | POA: Insufficient documentation

## 2012-05-30 DIAGNOSIS — G8929 Other chronic pain: Secondary | ICD-10-CM | POA: Insufficient documentation

## 2012-05-30 DIAGNOSIS — Z87891 Personal history of nicotine dependence: Secondary | ICD-10-CM | POA: Insufficient documentation

## 2012-05-30 DIAGNOSIS — Z79899 Other long term (current) drug therapy: Secondary | ICD-10-CM | POA: Insufficient documentation

## 2012-05-30 DIAGNOSIS — R1310 Dysphagia, unspecified: Secondary | ICD-10-CM | POA: Insufficient documentation

## 2012-05-30 DIAGNOSIS — F411 Generalized anxiety disorder: Secondary | ICD-10-CM | POA: Insufficient documentation

## 2012-05-30 DIAGNOSIS — K219 Gastro-esophageal reflux disease without esophagitis: Secondary | ICD-10-CM | POA: Insufficient documentation

## 2012-05-30 DIAGNOSIS — M2550 Pain in unspecified joint: Secondary | ICD-10-CM | POA: Insufficient documentation

## 2012-05-30 DIAGNOSIS — R42 Dizziness and giddiness: Secondary | ICD-10-CM | POA: Insufficient documentation

## 2012-05-30 DIAGNOSIS — M542 Cervicalgia: Secondary | ICD-10-CM | POA: Insufficient documentation

## 2012-05-30 DIAGNOSIS — IMO0001 Reserved for inherently not codable concepts without codable children: Secondary | ICD-10-CM | POA: Insufficient documentation

## 2012-05-30 DIAGNOSIS — Z8669 Personal history of other diseases of the nervous system and sense organs: Secondary | ICD-10-CM | POA: Insufficient documentation

## 2012-05-30 LAB — CBC
Hemoglobin: 14.1 g/dL (ref 13.0–17.0)
MCHC: 35 g/dL (ref 30.0–36.0)
Platelets: 173 10*3/uL (ref 150–400)
RBC: 4.46 MIL/uL (ref 4.22–5.81)

## 2012-05-30 LAB — HEPATIC FUNCTION PANEL
ALT: 43 U/L (ref 0–53)
AST: 31 U/L (ref 0–37)
Albumin: 3.7 g/dL (ref 3.5–5.2)
Total Protein: 7 g/dL (ref 6.0–8.3)

## 2012-05-30 LAB — BASIC METABOLIC PANEL
GFR calc non Af Amer: 85 mL/min — ABNORMAL LOW (ref >90)
Glucose, Bld: 114 mg/dL — ABNORMAL HIGH (ref 70–99)
Potassium: 4 mEq/L (ref 3.5–5.1)
Sodium: 142 mEq/L (ref 135–145)

## 2012-05-30 LAB — D-DIMER, QUANTITATIVE: D-Dimer, Quant: 0.51 ug/mL-FEU — ABNORMAL HIGH (ref 0.00–0.48)

## 2012-05-30 LAB — TROPONIN I: Troponin I: <0.3 ng/mL (ref <0.30)

## 2012-05-30 MED ORDER — LORAZEPAM 1 MG PO TABS
1.0000 mg | ORAL_TABLET | Freq: Once | ORAL | Status: AC
  Administered 2012-05-30: 1 mg via ORAL
  Filled 2012-05-30: qty 1

## 2012-05-30 MED ORDER — SODIUM CHLORIDE 0.9 % IV BOLUS (SEPSIS)
1000.0000 mL | Freq: Once | INTRAVENOUS | Status: AC
  Administered 2012-05-30: 1000 mL via INTRAVENOUS

## 2012-05-30 MED ORDER — OXYCODONE-ACETAMINOPHEN 5-325 MG PO TABS
1.0000 | ORAL_TABLET | Freq: Once | ORAL | Status: AC
  Administered 2012-05-30: 1 via ORAL
  Filled 2012-05-30 (×2): qty 1

## 2012-05-30 MED ORDER — ONDANSETRON 4 MG PO TBDP
4.0000 mg | ORAL_TABLET | Freq: Once | ORAL | Status: AC
  Administered 2012-05-30: 4 mg via ORAL
  Filled 2012-05-30: qty 1

## 2012-05-30 MED ORDER — IOHEXOL 350 MG/ML SOLN
100.0000 mL | Freq: Once | INTRAVENOUS | Status: AC | PRN
  Administered 2012-05-30: 100 mL via INTRAVENOUS

## 2012-05-30 NOTE — ED Notes (Signed)
Patient transported to CT 

## 2012-05-30 NOTE — ED Notes (Signed)
Pt c/o fullness feeling in throat, difficulty swallowing and breathing difficulty. Pt had cervical fusion in march

## 2012-05-30 NOTE — ED Provider Notes (Signed)
Patient signed out to me by Dr. Manus Gunning. Laboratory workup is significant only for borderline hyperglycemia with blood sugar 113. CT scans of the head, cervical spine, chest showed no acute process. Results are discussed with patient and his wife. He is discharged with instructions to followup with his PCP.  Results for orders placed during the hospital encounter of 05/30/12  TROPONIN I      Result Value Range   Troponin I <0.30  <0.30 ng/mL  CBC      Result Value Range   WBC 4.9  4.0 - 10.5 K/uL   RBC 4.46  4.22 - 5.81 MIL/uL   Hemoglobin 14.1  13.0 - 17.0 g/dL   HCT 16.1  09.6 - 04.5 %   MCV 90.4  78.0 - 100.0 fL   MCH 31.6  26.0 - 34.0 pg   MCHC 35.0  30.0 - 36.0 g/dL   RDW 40.9  81.1 - 91.4 %   Platelets 173  150 - 400 K/uL  BASIC METABOLIC PANEL      Result Value Range   Sodium 142  135 - 145 mEq/L   Potassium 4.0  3.5 - 5.1 mEq/L   Chloride 106  96 - 112 mEq/L   CO2 27  19 - 32 mEq/L   Glucose, Bld 114 (*) 70 - 99 mg/dL   BUN 11  6 - 23 mg/dL   Creatinine, Ser 7.82  0.50 - 1.35 mg/dL   Calcium 9.4  8.4 - 95.6 mg/dL   GFR calc non Af Amer 85 (*) >90 mL/min   GFR calc Af Amer >90  >90 mL/min  D-DIMER, QUANTITATIVE      Result Value Range   D-Dimer, Quant 0.51 (*) 0.00 - 0.48 ug/mL-FEU  HEPATIC FUNCTION PANEL      Result Value Range   Total Protein 7.0  6.0 - 8.3 g/dL   Albumin 3.7  3.5 - 5.2 g/dL   AST 31  0 - 37 U/L   ALT 43  0 - 53 U/L   Alkaline Phosphatase 62  39 - 117 U/L   Total Bilirubin 0.3  0.3 - 1.2 mg/dL   Bilirubin, Direct <2.1  0.0 - 0.3 mg/dL   Indirect Bilirubin NOT CALCULATED  0.3 - 0.9 mg/dL   Dg Chest 2 View  3/0/8657  *RADIOLOGY REPORT*  Clinical Data: Shortness of breath and dizziness.  CHEST - 2 VIEW  Comparison: 12/09/2011.  Findings: The cardiac silhouette, mediastinal and hilar contours are normal.  The lungs are clear.  No pleural effusion.  The bony thorax is intact.  Cervical fusion hardware noted.  IMPRESSION: No acute cardiopulmonary  findings.   Original Report Authenticated By: Rudie Meyer, M.D.    Dg Cervical Spine 2 Or 3 Views  05/22/2012  *RADIOLOGY REPORT*  Clinical Data: Follow-up ACDF surgery.  CERVICAL SPINE - 2-3 VIEW  Comparison: 04/10/2012.  Findings: The cervical spine is visualized from the occiput to the cervicothoracic junction.  The patient is status post C5-7 anterior cervical fusion with interbody spacers.  No hardware complications. There is straightening of the normal cervical lordosis. Prevertebral soft tissues are within normal limits.  Mild endplate degenerative changes and loss of disc space height of C4-5.  Dens is obscured on the dedicated views.  IMPRESSION:  1.  C5-7 anterior cervical fusion without complicating feature. 2.  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   Original Report Authenticated By: Leanna Battles, M.D.    Ct Head Wo Contrast  05/30/2012  *RADIOLOGY REPORT*  Clinical  Data:  Confusion.  Dizziness.  Shortness of breath.  CT HEAD WITHOUT CONTRAST CT CERVICAL SPINE WITHOUT CONTRAST  Technique:  Multidetector CT imaging of the head and cervical spine was performed following the standard protocol without intravenous contrast.  Multiplanar CT image reconstructions of the cervical spine were also generated.  Comparison:  12/09/2011; 03/22/2012  CT HEAD  Findings: The brain stem, cerebellum, cerebral peduncles, thalami, basal ganglia, basilar cisterns, and ventricular system appear unremarkable.  No intracranial hemorrhage, mass lesion, or acute infarction is identified.  IMPRESSION:  No significant abnormality identified.  CT CERVICAL SPINE  Findings: Anterior plate and screw fixator noted at C5-C6-C7 with solid interbody fusion  Posterior osseous ridging noted at C3-4, C4-5, and C5-6.  There is uncinate spurring at each of these levels, causing osseous foraminal stenosis on the right at C5-6 and a lesser degree of osseous foraminal narrowing on the right at C3-4 and C4-5.  A left-sided station II lymph node measures 10  mm in diameter, at the upper limits of normal. No cervical spine fracture or subluxation identified.  IMPRESSION:  1.  No acute cervical spine findings. 2.  Uncinate spurring causes right foraminal stenosis at C5-6 and equivocal right foraminal narrowing at C3-4 and C4-5. 3.  Solid interbody fusion at C5-6 and C6-7. 4.  Borderline enlarged left station II lymph node, 10 mm in short axis.   Original Report Authenticated By: Gaylyn Rong, M.D.    Ct Angio Chest Pe W/cm &/or Wo Cm  05/30/2012  *RADIOLOGY REPORT*.  Clinical Data: Shortness of breath and dizziness.  Confusion.  CT ANGIOGRAPHY CHEST  Technique:  Multidetector CT imaging of the chest using the standard protocol during bolus administration of intravenous contrast. Multiplanar reconstructed images including MIPs were obtained and reviewed to evaluate the vascular anatomy.  Contrast: OMNIPAQUE IOHEXOL 350 MG/ML SOLN  Comparison: No priors.  Findings:  Mediastinum: There are no filling defects within the pulmonary arterial tree to suggest underlying pulmonary embolism. Heart size is normal. There is no significant pericardial fluid, thickening or pericardial calcification. No pathologically enlarged mediastinal or hilar lymph nodes. The esophagus is unremarkable in appearance.  Lungs/Pleura: No acute consolidative airspace disease.  No pleural effusions.  No suspicious appearing pulmonary nodules or masses are identified.  Mosaic attenuation of the lung parenchyma throughout the mid and lower lungs bilaterally may suggest air trapping related to small airways disease.  Upper Abdomen: Unremarkable.  Musculoskeletal: There are no aggressive appearing lytic or blastic lesions noted in the visualized portions of the skeleton. Orthopedic fixation hardware in the lower cervical spine incompletely imaged.  IMPRESSION: 1.  No evidence of pulmonary embolism. 2.  The appearance of the lung parenchyma may suggest mild air trapping related to small airways  disease.   Original Report Authenticated By: Trudie Reed, M.D.    Ct Cervical Spine Wo Contrast  05/30/2012  *RADIOLOGY REPORT*  Clinical Data:  Confusion.  Dizziness.  Shortness of breath.  CT HEAD WITHOUT CONTRAST CT CERVICAL SPINE WITHOUT CONTRAST  Technique:  Multidetector CT imaging of the head and cervical spine was performed following the standard protocol without intravenous contrast.  Multiplanar CT image reconstructions of the cervical spine were also generated.  Comparison:  12/09/2011; 03/22/2012  CT HEAD  Findings: The brain stem, cerebellum, cerebral peduncles, thalami, basal ganglia, basilar cisterns, and ventricular system appear unremarkable.  No intracranial hemorrhage, mass lesion, or acute infarction is identified.  IMPRESSION:  No significant abnormality identified.  CT CERVICAL SPINE  Findings: Anterior plate  and screw fixator noted at C5-C6-C7 with solid interbody fusion  Posterior osseous ridging noted at C3-4, C4-5, and C5-6.  There is uncinate spurring at each of these levels, causing osseous foraminal stenosis on the right at C5-6 and a lesser degree of osseous foraminal narrowing on the right at C3-4 and C4-5.  A left-sided station II lymph node measures 10 mm in diameter, at the upper limits of normal. No cervical spine fracture or subluxation identified.  IMPRESSION:  1.  No acute cervical spine findings. 2.  Uncinate spurring causes right foraminal stenosis at C5-6 and equivocal right foraminal narrowing at C3-4 and C4-5. 3.  Solid interbody fusion at C5-6 and C6-7. 4.  Borderline enlarged left station II lymph node, 10 mm in short axis.   Original Report Authenticated By: Gaylyn Rong, M.D.       Dione Booze, MD 05/30/12 [phone]

## 2012-05-30 NOTE — ED Notes (Signed)
Family at bedside. 

## 2012-05-30 NOTE — ED Provider Notes (Signed)
History     CSN: [id number]  Arrival date & time 05/30/12  0614   First MD Initiated Contact with Patient 05/30/12 [phone]      Chief Complaint  Patient presents with  . Dysphagia  . Shortness of Breath  . Dizziness    (Consider location/radiation/quality/duration/timing/severity/associated sxs/prior treatment) HPI Comments: Patient presents with multiple complaints. He states he woke around 4 AM with a fullness feeling in his throat, difficulty swallowing, dizziness, tingling in his bilateral arms, neck pain, shortness of breath. He also feels like he is going to pass out. Describes lightheadedness but denies vertigo. No headache or visual change. No chest pain. No focal weakness. No difficulty, secretions. No difficulty walking or talking. History of anterior cervical fusion March 17. He said intermittent neck pain and tingling since then. Her neck pain and tingling he is having now is similar. He is not been having this pain every day. Denies any fevers, chills, abdominal pain, back pain. History of anxiety for which he is on Valium 5 mg 3 times daily.  The history is provided by the patient and the spouse.    Past Medical History  Diagnosis Date  . Hyperlipemia     sees Elizabeth Palau, NP [phone]  . GERD (gastroesophageal reflux disease)   . Anxiety   . Headache     hx of  . Chronic neck pain     hx of  . Glaucoma     seen q 6months by Dr. Hazle Quant    Past Surgical History  Procedure Laterality Date  . Vasectomy    . Deviated septum repair    . Tonsillectomy      as a child  . Anterior cervical decomp/discectomy fusion N/A 04/10/2012    Procedure: ANTERIOR CERVICAL DISCECTOMY FUSION C5 - C7 2 LEVEL;  Surgeon: Venita Lick, MD;  Location: [facility] OR;  Service: Orthopedics;  Laterality: N/A;    No family history on file.  History  Substance Use Topics  . Smoking status: Former Games developer  . Smokeless tobacco: Not on file  . Alcohol Use: Yes     Comment: occasional       Review of Systems  Constitutional: Positive for fatigue. Negative for fever, activity change and appetite change.  HENT: Positive for trouble swallowing and neck pain. Negative for sore throat, rhinorrhea, drooling and voice change.   Respiratory: Positive for shortness of breath. Negative for chest tightness.   Cardiovascular: Negative for chest pain.  Gastrointestinal: Positive for nausea. Negative for vomiting and abdominal pain.  Genitourinary: Negative for dysuria and hematuria.  Musculoskeletal: Positive for myalgias and arthralgias. Negative for back pain.  Skin: Negative for rash.  Neurological: Positive for dizziness, weakness and light-headedness. Negative for syncope, speech difficulty, numbness and headaches.  A complete 10 system review of systems was obtained and all systems are negative except as noted in the HPI and PMH.    Allergies  Minocin  Home Medications   Current Outpatient Rx  Name  Route  Sig  Dispense  Refill  . atorvastatin (LIPITOR) 10 MG tablet   Oral   Take 10 mg by mouth daily.           . diazepam (VALIUM) 5 MG tablet   Oral   Take 5 mg by mouth 3 (three) times daily.         Marland Kitchen docusate sodium (COLACE) 100 MG capsule   Oral   Take 1 capsule (100 mg total) by mouth 3 (three) times daily as  needed for constipation.   30 capsule   0   . esomeprazole (NEXIUM) 20 MG capsule   Oral   Take 20 mg by mouth daily as needed (for acid reflux).          . methocarbamol (ROBAXIN) 500 MG tablet   Oral   Take 1 tablet (500 mg total) by mouth 3 (three) times daily as needed.   60 tablet   0   . oxyCODONE-acetaminophen (PERCOCET/ROXICET) 5-325 MG per tablet   Oral   Take 1 tablet by mouth 3 (three) times daily as needed for pain.         Marland Kitchen zolpidem (AMBIEN) 10 MG tablet   Oral   Take 10 mg by mouth at bedtime as needed for sleep.         Marland Kitchen HYDROcodone-acetaminophen (NORCO) 10-325 MG per tablet   Oral   Take 1 tablet by mouth  every 6 (six) hours as needed for pain.   56 tablet   0   . ondansetron (ZOFRAN) 4 MG tablet   Oral   Take 1 tablet (4 mg total) by mouth every 8 (eight) hours as needed for nausea.   20 tablet   0   . polyethylene glycol powder (GLYCOLAX) powder   Oral   Take 17 g by mouth daily.   255 g   1     BP 109/74  Pulse 76  Temp(Src) 97.7 F (36.5 C) (Oral)  Resp 12  Ht 6\' 5"  (1.956 m)  Wt 260 lb (117.935 kg)  BMI 30.83 kg/m2  SpO2 98%  Physical Exam  Constitutional: He is oriented to person, place, and time. He appears well-developed and well-nourished. No distress.  HENT:  Head: Normocephalic and atraumatic.  Mouth/Throat: Oropharynx is clear and moist. No oropharyngeal exudate.  Oropharynx clear. No asymmetry. No tongue elevation. Controlling secretions  Eyes: Conjunctivae and EOM are normal. Pupils are equal, round, and reactive to light.  Neck: Normal range of motion. Neck supple.  Well-healed surgical incision  Cardiovascular: Normal rate, regular rhythm and normal heart sounds.   No murmur heard. Pulmonary/Chest: Effort normal. No respiratory distress.  Abdominal: Soft. There is no tenderness. There is no rebound and no guarding.  Musculoskeletal: Normal range of motion. He exhibits no edema and no tenderness.  Neurological: He is alert and oriented to person, place, and time. No cranial nerve deficit. He exhibits normal muscle tone. Coordination normal.  No nystagmus. No ataxia on finger to nose.  no pronator drift. 5 out of 5 strength throughout, cranial nerves 2-12 intact. Negative Romberg. Normal gait.  Skin: Skin is warm.    ED Course  Procedures (including critical care time)  Labs Reviewed  CBC  TROPONIN I  BASIC METABOLIC PANEL  D-DIMER, QUANTITATIVE  HEPATIC FUNCTION PANEL   No results found.   No diagnosis found.    MDM  Variety of vague complaints that onset around 4 AM. Fullness in throat, dysphagia, shortness of breath, dizziness,  lightheadedness, neck pain. Controlling secretions, no distress, vitals stable. Neuro exam is nonfocal. No ataxia.  Unremarkable TIA workup in November 2013 including negative MRI, MRA, echocardiogram, carotid Dopplers.  No difficulty swallowing, controlling secretions. Speaking in full sentences. Suspect symptoms related to anxiety. Labs unremarkable.   Patient feeling better than when he came in.  CT head, C spine, and CTA chest pending at time of sign out to Dr. Preston Fleeting.   Date: 05/30/2012  Rate: 72  Rhythm: normal sinus rhythm  QRS  Axis: normal  Intervals: normal  ST/T Wave abnormalities: normal  Conduction Disutrbances:none  Narrative Interpretation:   Old EKG Reviewed: unchanged    Glynn Octave, MD 05/30/12 1757

## 2012-06-13 ENCOUNTER — Encounter (HOSPITAL_BASED_OUTPATIENT_CLINIC_OR_DEPARTMENT_OTHER): Payer: Self-pay | Admitting: *Deleted

## 2012-06-13 ENCOUNTER — Emergency Department (HOSPITAL_BASED_OUTPATIENT_CLINIC_OR_DEPARTMENT_OTHER)
Admission: EM | Admit: 2012-06-13 | Discharge: 2012-06-13 | Disposition: A | Discharge status: Home / Self Care | Attending: Emergency Medicine | Admitting: Emergency Medicine

## 2012-06-13 DIAGNOSIS — F329 Major depressive disorder, single episode, unspecified: Secondary | ICD-10-CM | POA: Insufficient documentation

## 2012-06-13 DIAGNOSIS — G8929 Other chronic pain: Secondary | ICD-10-CM | POA: Insufficient documentation

## 2012-06-13 DIAGNOSIS — Z87891 Personal history of nicotine dependence: Secondary | ICD-10-CM | POA: Insufficient documentation

## 2012-06-13 DIAGNOSIS — F419 Anxiety disorder, unspecified: Secondary | ICD-10-CM

## 2012-06-13 DIAGNOSIS — F3289 Other specified depressive episodes: Secondary | ICD-10-CM | POA: Insufficient documentation

## 2012-06-13 DIAGNOSIS — E785 Hyperlipidemia, unspecified: Secondary | ICD-10-CM | POA: Insufficient documentation

## 2012-06-13 DIAGNOSIS — K219 Gastro-esophageal reflux disease without esophagitis: Secondary | ICD-10-CM | POA: Insufficient documentation

## 2012-06-13 DIAGNOSIS — Z79899 Other long term (current) drug therapy: Secondary | ICD-10-CM | POA: Insufficient documentation

## 2012-06-13 DIAGNOSIS — F411 Generalized anxiety disorder: Secondary | ICD-10-CM | POA: Insufficient documentation

## 2012-06-13 DIAGNOSIS — T43205A Adverse effect of unspecified antidepressants, initial encounter: Secondary | ICD-10-CM | POA: Insufficient documentation

## 2012-06-13 DIAGNOSIS — Z8669 Personal history of other diseases of the nervous system and sense organs: Secondary | ICD-10-CM | POA: Insufficient documentation

## 2012-06-13 MED ORDER — ONDANSETRON 8 MG PO TBDP: 8.0000 mg | ORAL_TABLET | Freq: Three times a day (TID) | ORAL | Status: DC | PRN

## 2012-06-13 MED ORDER — ONDANSETRON 4 MG PO TBDP
4.0000 mg | ORAL_TABLET | Freq: Once | ORAL | Status: AC
  Administered 2012-06-13: 4 mg via ORAL
  Filled 2012-06-13: qty 1

## 2012-06-13 NOTE — ED Notes (Signed)
Pt took cymbalta for the first time last night and since has felt nauseas and has been having hot flashes.

## 2012-06-13 NOTE — ED Provider Notes (Signed)
History     CSN: [id number]  Arrival date & time 06/13/12  4098   None     Chief Complaint  Patient presents with  . Medication Reaction    (Consider location/radiation/quality/duration/timing/severity/associated sxs/prior treatment) HPI Patient began cymbalta last night for anxiety and depression.  Early am became naueated and feels "jittery" "like I'm going to jump out of my skin" feels like he is going to have an anxiety attack.  No si, hi, psychosis present.  Patient felt like throat was full.  He has ativan but took a pain pill as he states he wasn't sure how ativa would interact with the cymbalta.  Patient has been taking narcotic pain meds since cervical lamintectomy in March.  He denies headache, numbness, tingling, weakness, chest pain.  Continues to complain of nausea.  Past Medical History  Diagnosis Date  . Hyperlipemia     sees Elizabeth Palau, NP [phone]  . GERD (gastroesophageal reflux disease)   . Anxiety   . Headache     hx of  . Chronic neck pain     hx of  . Glaucoma     seen q 6months by Dr. Hazle Quant    Past Surgical History  Procedure Laterality Date  . Vasectomy    . Deviated septum repair    . Tonsillectomy      as a child  . Anterior cervical decomp/discectomy fusion N/A 04/10/2012    Procedure: ANTERIOR CERVICAL DISCECTOMY FUSION C5 - C7 2 LEVEL;  Surgeon: Venita Lick, MD;  Location: [facility] OR;  Service: Orthopedics;  Laterality: N/A;    No family history on file.  History  Substance Use Topics  . Smoking status: Former Games developer  . Smokeless tobacco: Not on file  . Alcohol Use: Yes     Comment: occasional      Review of Systems  All other systems reviewed and are negative.    Allergies  Minocin  Home Medications   Current Outpatient Rx  Name  Route  Sig  Dispense  Refill  . atorvastatin (LIPITOR) 10 MG tablet   Oral   Take 10 mg by mouth daily.           . diazepam (VALIUM) 5 MG tablet   Oral   Take 5 mg by mouth 3 (three)  times daily.         Marland Kitchen docusate sodium (COLACE) 100 MG capsule   Oral   Take 1 capsule (100 mg total) by mouth 3 (three) times daily as needed for constipation.   30 capsule   0   . esomeprazole (NEXIUM) 20 MG capsule   Oral   Take 20 mg by mouth daily as needed (for acid reflux).          Marland Kitchen HYDROcodone-acetaminophen (NORCO) 10-325 MG per tablet   Oral   Take 1 tablet by mouth every 6 (six) hours as needed for pain.   56 tablet   0   . methocarbamol (ROBAXIN) 500 MG tablet   Oral   Take 1 tablet (500 mg total) by mouth 3 (three) times daily as needed.   60 tablet   0   . ondansetron (ZOFRAN) 4 MG tablet   Oral   Take 1 tablet (4 mg total) by mouth every 8 (eight) hours as needed for nausea.   20 tablet   0   . oxyCODONE-acetaminophen (PERCOCET/ROXICET) 5-325 MG per tablet   Oral   Take 1 tablet by mouth 3 (three) times daily as  needed for pain.         . polyethylene glycol powder (GLYCOLAX) powder   Oral   Take 17 g by mouth daily.   255 g   1   . zolpidem (AMBIEN) 10 MG tablet   Oral   Take 10 mg by mouth at bedtime as needed for sleep.           BP 149/89  Pulse 75  Resp 20  SpO2 99%  Physical Exam  Nursing note and vitals reviewed. Constitutional: He is oriented to person, place, and time. He appears well-developed and well-nourished.  HENT:  Head: Normocephalic and atraumatic.  Right Ear: External ear normal.  Left Ear: External ear normal.  Nose: Nose normal.  Mouth/Throat: Oropharynx is clear and moist.  Eyes: Conjunctivae and EOM are normal. Pupils are equal, round, and reactive to light.  Neck: Normal range of motion. Neck supple.  Cardiovascular: Normal rate, regular rhythm, normal heart sounds and intact distal pulses.   Pulmonary/Chest: Effort normal and breath sounds normal. No respiratory distress. He has no wheezes. He exhibits no tenderness.  Abdominal: Soft. Bowel sounds are normal. He exhibits no distension and no mass. There  is no tenderness. There is no guarding.  Musculoskeletal: Normal range of motion.  Neurological: He is alert and oriented to person, place, and time. He has normal reflexes. He exhibits normal muscle tone. Coordination normal.  Skin: Skin is warm and dry.  Psychiatric: He has a normal mood and affect. His behavior is normal. Judgment and thought content normal.    ED Course  Procedures (including critical care time)  Labs Reviewed - No data to display No results found.   1. Anxiety       MDM  Patient counseled extensively and advised to hold cymbalta until he talks with prescriber today.  He has ativan and will take 1 mg after he gets home as he drove.       Hilario Quarry, MD 06/13/12 [phone]

## 2012-07-14 ENCOUNTER — Inpatient Hospital Stay (HOSPITAL_COMMUNITY)
Admission: AD | Admit: 2012-07-14 | Discharge: 2012-07-18 | DRG: 885 | Disposition: A | Discharge status: Home / Self Care | Source: Intra-hospital | Attending: Psychiatry | Admitting: Psychiatry

## 2012-07-14 ENCOUNTER — Encounter (HOSPITAL_COMMUNITY): Payer: Self-pay | Admitting: *Deleted

## 2012-07-14 ENCOUNTER — Emergency Department (HOSPITAL_COMMUNITY)
Admission: EM | Admit: 2012-07-14 | Discharge: 2012-07-14 | Disposition: A | Discharge status: Other Acute Inpatient Hospital | Attending: Emergency Medicine | Admitting: Emergency Medicine

## 2012-07-14 DIAGNOSIS — M79609 Pain in unspecified limb: Secondary | ICD-10-CM | POA: Insufficient documentation

## 2012-07-14 DIAGNOSIS — F419 Anxiety disorder, unspecified: Secondary | ICD-10-CM

## 2012-07-14 DIAGNOSIS — G8929 Other chronic pain: Secondary | ICD-10-CM | POA: Insufficient documentation

## 2012-07-14 DIAGNOSIS — Z79899 Other long term (current) drug therapy: Secondary | ICD-10-CM | POA: Insufficient documentation

## 2012-07-14 DIAGNOSIS — F411 Generalized anxiety disorder: Secondary | ICD-10-CM | POA: Diagnosis present

## 2012-07-14 DIAGNOSIS — H5316 Psychophysical visual disturbances: Secondary | ICD-10-CM | POA: Insufficient documentation

## 2012-07-14 DIAGNOSIS — R45851 Suicidal ideations: Secondary | ICD-10-CM | POA: Insufficient documentation

## 2012-07-14 DIAGNOSIS — F3289 Other specified depressive episodes: Secondary | ICD-10-CM | POA: Insufficient documentation

## 2012-07-14 DIAGNOSIS — K219 Gastro-esophageal reflux disease without esophagitis: Secondary | ICD-10-CM | POA: Insufficient documentation

## 2012-07-14 DIAGNOSIS — M542 Cervicalgia: Secondary | ICD-10-CM | POA: Diagnosis present

## 2012-07-14 DIAGNOSIS — F332 Major depressive disorder, recurrent severe without psychotic features: Principal | ICD-10-CM

## 2012-07-14 DIAGNOSIS — R209 Unspecified disturbances of skin sensation: Secondary | ICD-10-CM | POA: Insufficient documentation

## 2012-07-14 DIAGNOSIS — H409 Unspecified glaucoma: Secondary | ICD-10-CM | POA: Diagnosis present

## 2012-07-14 DIAGNOSIS — Z87891 Personal history of nicotine dependence: Secondary | ICD-10-CM | POA: Insufficient documentation

## 2012-07-14 DIAGNOSIS — F410 Panic disorder [episodic paroxysmal anxiety] without agoraphobia: Secondary | ICD-10-CM

## 2012-07-14 DIAGNOSIS — K5900 Constipation, unspecified: Secondary | ICD-10-CM | POA: Insufficient documentation

## 2012-07-14 DIAGNOSIS — E785 Hyperlipidemia, unspecified: Secondary | ICD-10-CM | POA: Diagnosis present

## 2012-07-14 DIAGNOSIS — M47812 Spondylosis without myelopathy or radiculopathy, cervical region: Secondary | ICD-10-CM

## 2012-07-14 DIAGNOSIS — Z8669 Personal history of other diseases of the nervous system and sense organs: Secondary | ICD-10-CM | POA: Insufficient documentation

## 2012-07-14 DIAGNOSIS — Z9889 Other specified postprocedural states: Secondary | ICD-10-CM | POA: Insufficient documentation

## 2012-07-14 DIAGNOSIS — F329 Major depressive disorder, single episode, unspecified: Secondary | ICD-10-CM | POA: Insufficient documentation

## 2012-07-14 LAB — COMPREHENSIVE METABOLIC PANEL
ALT: 24 U/L (ref 0–53)
CO2: 28 mEq/L (ref 19–32)
Calcium: 9.7 mg/dL (ref 8.4–10.5)
Creatinine, Ser: 1.03 mg/dL (ref 0.50–1.35)
GFR calc Af Amer: >90 mL/min (ref >90)
GFR calc non Af Amer: 81 mL/min — ABNORMAL LOW (ref >90)
Glucose, Bld: 86 mg/dL (ref 70–99)
Sodium: 138 mEq/L (ref 135–145)

## 2012-07-14 LAB — CBC
Hemoglobin: 15.3 g/dL (ref 13.0–17.0)
MCH: 31.6 pg (ref 26.0–34.0)
MCV: 88.6 fL (ref 78.0–100.0)
RBC: 4.84 MIL/uL (ref 4.22–5.81)

## 2012-07-14 LAB — RAPID URINE DRUG SCREEN, HOSP PERFORMED
Opiates: POSITIVE — AB
Tetrahydrocannabinol: NOT DETECTED

## 2012-07-14 MED ORDER — ATORVASTATIN CALCIUM 10 MG PO TABS
10.0000 mg | ORAL_TABLET | Freq: Every day | ORAL | Status: DC
  Administered 2012-07-15 – 2012-07-17 (×3): 10 mg via ORAL
  Filled 2012-07-14 (×5): qty 1

## 2012-07-14 MED ORDER — ACETAMINOPHEN 325 MG PO TABS: 650.0000 mg | ORAL_TABLET | Freq: Four times a day (QID) | ORAL | Status: DC | PRN

## 2012-07-14 MED ORDER — HYDROCODONE-ACETAMINOPHEN 10-325 MG PO TABS
1.0000 | ORAL_TABLET | Freq: Four times a day (QID) | ORAL | Status: DC | PRN
  Administered 2012-07-14 – 2012-07-18 (×10): 1 via ORAL
  Filled 2012-07-14 (×10): qty 1

## 2012-07-14 MED ORDER — NICOTINE 21 MG/24HR TD PT24: 21.0000 mg | MEDICATED_PATCH | Freq: Every day | TRANSDERMAL | Status: DC

## 2012-07-14 MED ORDER — ACETAMINOPHEN 325 MG PO TABS: 650.0000 mg | ORAL_TABLET | ORAL | Status: DC | PRN

## 2012-07-14 MED ORDER — IBUPROFEN 200 MG PO TABS: 600.0000 mg | ORAL_TABLET | Freq: Three times a day (TID) | ORAL | Status: DC | PRN

## 2012-07-14 MED ORDER — ALUM & MAG HYDROXIDE-SIMETH 200-200-20 MG/5ML PO SUSP: 30.0000 mL | ORAL | Status: DC | PRN

## 2012-07-14 MED ORDER — MAGNESIUM HYDROXIDE 400 MG/5ML PO SUSP
30.0000 mL | Freq: Every day | ORAL | Status: DC | PRN
  Administered 2012-07-15: 30 mL via ORAL

## 2012-07-14 MED ORDER — PANTOPRAZOLE SODIUM 40 MG PO TBEC
40.0000 mg | DELAYED_RELEASE_TABLET | Freq: Every day | ORAL | Status: DC
  Administered 2012-07-15 – 2012-07-18 (×4): 40 mg via ORAL
  Filled 2012-07-14 (×6): qty 1

## 2012-07-14 MED ORDER — ALPRAZOLAM 0.5 MG PO TABS
0.5000 mg | ORAL_TABLET | Freq: Three times a day (TID) | ORAL | Status: DC
  Administered 2012-07-15 – 2012-07-16 (×5): 0.5 mg via ORAL
  Filled 2012-07-14 (×5): qty 1

## 2012-07-14 MED ORDER — LORAZEPAM 1 MG PO TABS
1.0000 mg | ORAL_TABLET | Freq: Three times a day (TID) | ORAL | Status: DC | PRN
  Administered 2012-07-14: 1 mg via ORAL
  Filled 2012-07-14: qty 2

## 2012-07-14 MED ORDER — CITALOPRAM HYDROBROMIDE 10 MG/5ML PO SOLN
30.0000 mg | Freq: Every day | ORAL | Status: DC
  Filled 2012-07-14: qty 20

## 2012-07-14 MED ORDER — TRAZODONE HCL 50 MG PO TABS
50.0000 mg | ORAL_TABLET | Freq: Every evening | ORAL | Status: DC | PRN
  Administered 2012-07-14 – 2012-07-17 (×4): 50 mg via ORAL
  Filled 2012-07-14 (×2): qty 1
  Filled 2012-07-14: qty 4
  Filled 2012-07-14: qty 1

## 2012-07-14 MED ORDER — ONDANSETRON HCL 4 MG PO TABS: 4.0000 mg | ORAL_TABLET | Freq: Three times a day (TID) | ORAL | Status: DC | PRN

## 2012-07-14 MED ORDER — ZOLPIDEM TARTRATE 5 MG PO TABS: 5.0000 mg | ORAL_TABLET | Freq: Every evening | ORAL | Status: DC | PRN

## 2012-07-14 NOTE — BH Assessment (Signed)
Assessment Note   Jordan Barton is an 52 y.o. male that was referred by his PCP, Cherylann Ratel at [facility] for the treatment of his increasing and worsening depression and anxiety.  Pt is tearful, despondent, and shakey as he explains his worsening symptoms.  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 Pt voices fear, anxiety, including panic attacks at least three times weekly and lately, fears of death and even thoughts of jumping off his roof or drowning.  Pt cannot voice whether or not these are active suicidal thoughts, but is concerned about how withdrawn and isolated he has become from his family.  Pt is now unable to work full days because of the panic and has even been in the ER recently with r/o MI and fatigue.  Pt reports a family hx of mental health and substance abuse concerns and admits that his has increased in the last five things have worsened, but in the last six months since neck surgery, pt is inconsolable at times and unable to perform daily tasks.  Pt has an MRI scheduled on Monday for ongoing weakness in legs and pain related to the surgery.  Pt becomes inconsolable when questioned as to whether he is able to contract for safety and voices, "I am scared of myself and what will happen." Pt's wife is in agreement that inpatient treatment is the best course of action and would like to be considered for inpatient treatment.  Axis I:  MDD, severe and Anxiety D/O NOS Axis II: Deferred Axis III:  Past Medical History  Diagnosis Date  . Hyperlipemia     sees Elizabeth Palau, NP [phone]  . GERD (gastroesophageal reflux disease)   . Anxiety   . Headache(784.0)     hx of  . Chronic neck pain     hx of  . Glaucoma     seen q 6months by Dr. Hazle Quant   Axis IV: other psychosocial or environmental problems, problems with access to health care services and physical complaints Axis V: 31-40  impairment in reality testing  Past Medical History:  Past Medical History  Diagnosis Date  . Hyperlipemia     sees Elizabeth Palau, NP [phone]  . GERD (gastroesophageal reflux disease)   . Anxiety   . Headache(784.0)     hx of  . Chronic neck pain     hx of  . Glaucoma     seen q 6months by Dr. Hazle Quant    Past Surgical History  Procedure Laterality Date  . Vasectomy    . Deviated septum repair    . Tonsillectomy      as a child  . Anterior cervical decomp/discectomy fusion N/A 04/10/2012    Procedure: ANTERIOR CERVICAL DISCECTOMY FUSION C5 - C7 2 LEVEL;  Surgeon: Venita Lick, MD;  Location: [facility] OR;  Service: Orthopedics;  Laterality: N/A;    Family History: History reviewed. No pertinent family history.  Social History:  reports that he has quit smoking. He does not have any smokeless tobacco history on file. He reports that  drinks alcohol. He reports that he does not use illicit drugs.  Additional Social History:  Alcohol / Drug Use Pain Medications: Yes; see MAR Prescriptions: Yes; see MAR Over the Counter: Yes; prn; see MAR History of alcohol / drug use?: No history of alcohol / drug abuse  CIWA: CIWA-Ar BP: 120/76 mmHg Pulse Rate: 58 COWS:  Allergies:  Allergies  Allergen Reactions  . Duloxetine Other (See Comments)    "the walls were melting" and he "was freaking out"  . Minocin (Minocycline Hcl) Hives and Swelling    Not sure if reaction is related to the medication    Home Medications:  (Not in a hospital admission)  OB/GYN Status:  No LMP for male patient.  General Assessment Data Location of Assessment: [facility] ED Living Arrangements: Spouse/significant other;Children (2 children) Can pt return to current living arrangement?: Yes Admission Status: Voluntary Is patient capable of signing voluntary admission?: Yes Transfer from: Acute Hospital Referral Source: MD (referred by Cherylann Ratel with [facility])  Education Status Is patient  currently in school?: No  Risk to self Suicidal Ideation: Yes-Currently Present Suicidal Intent: No Is patient at risk for suicide?: Yes Suicidal Plan?: No (but pervasive thoughts of dying) Access to Means: Yes Specify Access to Suicidal Means: medications and sharps available What has been your use of drugs/alcohol within the last 12 months?: socially drinks; none since back surgery in March Previous Attempts/Gestures: No How many times?: 0 Other Self Harm Risks: recently unpredictable Triggers for Past Attempts: None known Intentional Self Injurious Behavior: None Family Suicide History: No Recent stressful life event(s): Recent negative physical changes;Trauma (Comment) (feeling increased anxiety and depression and thoughts of dyi) Persecutory voices/beliefs?: No Depression: Yes Depression Symptoms: Insomnia;Tearfulness;Isolating;Fatigue;Guilt;Loss of interest in usual pleasures;Feeling worthless/self pity Substance abuse history and/or treatment for substance abuse?: No Suicide prevention information given to non-admitted patients: Not applicable  Risk to Others Homicidal Ideation: No Thoughts of Harm to Others: No Current Homicidal Intent: No Current Homicidal Plan: No Access to Homicidal Means: No Identified Victim: pt denies History of harm to others?: No Assessment of Violence:  Media planner) Violent Behavior Description: pt is somnolent and tearful in assessment Does patient have access to weapons?: No Criminal Charges Pending?: No Does patient have a court date: No  Psychosis Hallucinations: None noted Delusions: None noted  Mental Status Report Appear/Hygiene:  (casual in scrubs) Eye Contact: Fair Motor Activity: Unremarkable Speech: Soft Level of Consciousness: Alert Mood: Depressed;Anxious;Ambivalent;Apathetic;Ashamed/humiliated;Despair Affect: Anxious;Apathetic;Appropriate to circumstance;Depressed Anxiety Level: Panic Attacks Panic attack frequency: 2-3  times weekly at least Most recent panic attack: 06/189/2014 Thought Processes: Relevant Judgement: Unimpaired Orientation: Person;Place;Time;Situation;Appropriate for developmental age Obsessive Compulsive Thoughts/Behaviors: Minimal  Cognitive Functioning Concentration: Decreased Memory: Remote Intact;Recent Impaired IQ: Average Insight: Fair Impulse Control: Fair Appetite: Fair Sleep: Decreased Total Hours of Sleep:  (unable to sleep through night, even with medication- <6 hrs) Vegetative Symptoms: None  ADLScreening [facility] Services) Patient's cognitive ability adequate to safely complete daily activities?: Yes Patient able to express need for assistance with ADLs?: Yes Independently performs ADLs?: Yes (appropriate for developmental age)  Abuse/Neglect [facility]) Physical Abuse: Denies Verbal Abuse: Denies Sexual Abuse: Denies  Prior Inpatient Therapy Prior Inpatient Therapy: No Prior Therapy Dates: n/a Prior Therapy Facilty/Provider(s): n/a Reason for Treatment: n/a  Prior Outpatient Therapy Prior Outpatient Therapy: No Prior Therapy Dates: treated by PCP Prior Therapy Facilty/Provider(s): treated by PCP Reason for Treatment: anxiety and depression  ADL Screening (condition at time of admission) Patient's cognitive ability adequate to safely complete daily activities?: Yes Patient able to express need for assistance with ADLs?: Yes Independently performs ADLs?: Yes (appropriate for developmental age) Weakness of Legs: Right (MRI scheduled on Monday of lower back) Weakness of Arms/Hands: None       Abuse/Neglect Assessment (Assessment to be complete while patient is alone) Physical Abuse: Denies Verbal Abuse: Denies Sexual Abuse:  Denies Exploitation of patient/patient's resources: Denies Self-Neglect: Denies Values / Beliefs Cultural Requests During Hospitalization: None Spiritual Requests During Hospitalization: None   Advance Directives (For  Healthcare) Advance Directive: Patient has advance directive, copy not in chart Type of Advance Directive: Living will;Healthcare Power of Attorney    Additional Information 1:1 In Past 12 Months?: No CIRT Risk: No Elopement Risk: No Does patient have medical clearance?: Yes     Disposition:  Please consider for inpatient treatment.   Disposition Initial Assessment Completed for this Encounter: Yes Disposition of Patient: Inpatient treatment program  On Site Evaluation by:   Reviewed with Physician:     Angelica Ran 07/14/2012 5:33 PM

## 2012-07-14 NOTE — ED Notes (Signed)
Report given to behavior health nurse will transport pt. After eating dinner.

## 2012-07-14 NOTE — ED Notes (Signed)
Pt doesn't smoke cigarettes/use tobacco products

## 2012-07-14 NOTE — ED Notes (Signed)
Pt has been consumed with death/spoke with PCP about this/ over past 2-3 weeks; channeling in on electronic devices gradually a little over one month and gradually over past 2-3 weeks/ very little engagement with family and focusing in on ipad/television

## 2012-07-14 NOTE — ED Provider Notes (Signed)
History  This chart was scribed for Carleene Cooper III, MD by Ardelia Mems, ED Scribe. This patient was seen in room TR11C/TR11C and the patient's care was started at 3:07 PM.   CSN: [id number]  Arrival date & time 07/14/12  1413      Chief Complaint  Patient presents with  . Medical Clearance     The history is provided by the patient. No language interpreter was used.    HPI Comments: Jordan Barton is a 52 y.o. male with a hx of anxiety or depression who presents to the Emergency Department for assessment of his anxiety and depression. Pt states that he met with his PA yesterday who advised him to come to ED for medical clearance if he has unmanageable anxiety and depression. Pt states that he wants his energy and positive mood back, as well as the pain from a neck surgery in March gone. Pt states that he doesn't know where to turn and is turning to ED for help. Pt admits to "thoughts of dying" for the last few week, but denies having a specific plan for suicide. Pt denies HI, auditory hallucinations. Pt reports possible visual hallucinations. Pt admits a hx of drug and alcohol abuse, but currently drinks wine only occasionally. Pt denies any recent illicit drug use. Pt states that he took Norco 3x a day for pain after neck surgery in March, and now is down to 1x a day. Pt states that he now has pain in his neck and right arm, along with numbness in his right leg, which he believes is due to surgery. Pt states that he has an MRI scheduled in 3 days to assess numbness. Pt states that there is no recent injury or pain other than his chronic neck pain. Pt states that he also takes Lipitor on a regular basis. Pt reports constipation since March from [facility]. Pt denies fever, cough, nausea, vomiting, SOB, chest pain, hematuria, dysuria, bladder or bowel incontinence, flank pain or any other symptoms.  PCP- Dr. Elizabeth Palau  Past Medical History  Diagnosis Date  . Hyperlipemia     sees  Elizabeth Palau, NP [phone]  . GERD (gastroesophageal reflux disease)   . Anxiety   . Headache(784.0)     hx of  . Chronic neck pain     hx of  . Glaucoma     seen q 6months by Dr. Hazle Quant    Past Surgical History  Procedure Laterality Date  . Vasectomy    . Deviated septum repair    . Tonsillectomy      as a child  . Anterior cervical decomp/discectomy fusion N/A 04/10/2012    Procedure: ANTERIOR CERVICAL DISCECTOMY FUSION C5 - C7 2 LEVEL;  Surgeon: Venita Lick, MD;  Location: [facility] OR;  Service: Orthopedics;  Laterality: N/A;    History reviewed. No pertinent family history.  History  Substance Use Topics  . Smoking status: Former Games developer  . Smokeless tobacco: Not on file  . Alcohol Use: Yes     Comment: occasional      Review of Systems  Constitutional: Negative for fever and diaphoresis.  HENT: Positive for neck pain. Negative for neck stiffness.        Chronic cervical spine pain s/p fusion of C5,6,7  Eyes: Negative for visual disturbance.  Respiratory: Negative for apnea, cough, chest tightness and shortness of breath.   Cardiovascular: Negative for chest pain and palpitations.  Gastrointestinal: Positive for constipation. Negative for nausea, vomiting and diarrhea.  Chronic constipation associated with pain meds  Genitourinary: Negative for dysuria and hematuria.       Denies bladder/bowel incontinence.  Musculoskeletal: Negative for gait problem.       Right arm pain.  Skin: Negative for rash.  Neurological: Negative for dizziness, weakness, light-headedness and headaches. Numbness: Right leg.       Chronic numbness to right leg  Psychiatric/Behavioral: Negative for self-injury. Hallucinations: Visual only.       Anxiety, Depression. While no active suicidal ideations, does have "dream like" thoughts of dying. Pt considers this "dream like" state hallucinogenic. Denies auditory hallucinations.    A complete 10 system review of systems was obtained and  all systems are negative except as noted in the HPI and PMH.   Allergies  Duloxetine and Minocin  Home Medications   Current Outpatient Rx  Name  Route  Sig  Dispense  Refill  . ALPRAZolam (XANAX) 0.5 MG tablet   Oral   Take 0.5 mg by mouth 3 (three) times daily as needed for anxiety.         Marland Kitchen atorvastatin (LIPITOR) 10 MG tablet   Oral   Take 10 mg by mouth daily.           . citalopram (CELEXA) 20 MG tablet   Oral   Take 20 mg by mouth daily.         Marland Kitchen esomeprazole (NEXIUM) 20 MG capsule   Oral   Take 20 mg by mouth daily as needed (for acid reflux).          Marland Kitchen HYDROcodone-acetaminophen (NORCO) 10-325 MG per tablet   Oral   Take 1 tablet by mouth every 6 (six) hours as needed for pain.   56 tablet   0   . ondansetron (ZOFRAN) 4 MG tablet   Oral   Take 1 tablet (4 mg total) by mouth every 8 (eight) hours as needed for nausea.   20 tablet   0   . zolpidem (AMBIEN CR) 12.5 MG CR tablet   Oral   Take 12.5 mg by mouth at bedtime as needed for sleep.           Triage Vitals: BP 113/73  Pulse 81  Temp(Src) 98.2 F (36.8 C) (Oral)  Resp 16  SpO2 97%  Physical Exam  Nursing note and vitals reviewed. Constitutional: He is oriented to person, place, and time. He appears well-developed and well-nourished. No distress.  HENT:  Head: Normocephalic and atraumatic.  Eyes: Conjunctivae and EOM are normal.  Neck: Normal range of motion. Neck supple.  Tenderness of C-spine due to chronic neck pain. No meningeal signs.   Cardiovascular: Normal rate, regular rhythm and normal heart sounds.  Exam reveals no gallop and no friction rub.   No murmur heard. Pulmonary/Chest: Effort normal and breath sounds normal. No respiratory distress. He has no wheezes. He has no rales. He exhibits no tenderness.  Abdominal: Soft. Bowel sounds are normal. He exhibits no distension. There is no tenderness. There is no rebound and no guarding.  Musculoskeletal: Normal range of  motion. He exhibits no edema and no tenderness.  FROM to upper and lower extremities No step-offs noted on C-spine No tenderness to palpation of the spinous processes of the C-spine, T-spine or L-spine Full range of motion of C-spine, T-spine or L-spine Mild tenderness to palpation of the paraspinous muscles   Neurological: He is alert and oriented to person, place, and time. No cranial nerve deficit.  Speech is clear  and goal oriented, follows commands Sensation normal to light touch and two point discrimination Moves extremities without ataxia, coordination intact Normal gait and balance Normal strength in upper and lower extremities bilaterally including dorsiflexion and plantar flexion, strong and equal grip strength   Skin: Skin is warm and dry. He is not diaphoretic. No erythema.  Psychiatric:  Anxious, will not maintain eye contact while talking, dysphoric    ED Course  Procedures (including critical care time)  DIAGNOSTIC STUDIES: Oxygen Saturation is 97% on RA, normal by my interpretation.    COORDINATION OF CARE: 4:00 PM- Pt advised of plan for treatment and pt agrees.  Medications  alum & mag hydroxide-simeth (MAALOX/MYLANTA) 200-200-20 MG/5ML suspension 30 mL (not administered)  ondansetron (ZOFRAN) tablet 4 mg (not administered)  nicotine (NICODERM CQ - dosed in mg/24 hours) patch 21 mg (not administered)  zolpidem (AMBIEN) tablet 5 mg (not administered)  ibuprofen (ADVIL,MOTRIN) tablet 600 mg (not administered)  acetaminophen (TYLENOL) tablet 650 mg (not administered)  LORazepam (ATIVAN) tablet 1 mg (1 mg Oral Given 07/14/12 1656)      Labs Reviewed  COMPREHENSIVE METABOLIC PANEL - Abnormal; Notable for the following:    GFR calc non Af Amer 81 (*)    All other components within normal limits  SALICYLATE LEVEL - Abnormal; Notable for the following:    Salicylate Lvl <2.0 (*)    All other components within normal limits  URINE RAPID DRUG SCREEN (HOSP  PERFORMED) - Abnormal; Notable for the following:    Opiates POSITIVE (*)    Benzodiazepines POSITIVE (*)    All other components within normal limits  ACETAMINOPHEN LEVEL  CBC  ETHANOL   No results found.   No diagnosis found.    MDM  Pt presents to the ED for medical clearance.  Pt is currently having thoughts of dying, though does not have a clear plan to hurt himself. Pt states he sometimes feels in a semi-dream state where he sees himself falling off a building. Pt denies HI. Somewhat endorses visual hallucinations while in this semi-dream state with thoughts of dying. Denies audio hallucinations.  Denies ETOH abuse or illicit drug use.   The patients demeanor is dysphoric. Admits difficulty sleeping, loss of interests, feelings of worthlessness, lack of energy, difficulty concentrating, loss of appetite, feelings of anxiety. Pt has a feeling of "everything unraveling" and just wants to be rid of the constant physical pain he is in post operatively. (Cervical spine fusion of C5, 6, 7 in March 2014.)  The patient currently does not have any acute physical complaints and is in no acute distress.  The patient was brought to ED by self an is seeking an evaluation by behavioral health as far as what options are available to him whether in an outpt or inpt setting.Pt has never sought either though has been treated medically for anxiety and depression for a number of years.   On re-evaluation, discussed findings of opiates in pt urine (asked family member to step out during discussion). Pt denies any opiate use and maintains he has used only his prescription medication.   ACT consult was appreciated and pt was moved to Psych ED for further evaluation.  I personally performed the services described in this documentation, which was scribed in my presence. The recorded information has been reviewed and is accurate.            Glade Nurse, PA-C 07/14/12 1636  Glade Nurse,  PA-C 07/14/12 1730  Glade Nurse, PA-C 07/14/12  1944 

## 2012-07-14 NOTE — ED Notes (Signed)
Pt reports being treated by pcp over past few weeks for anxiety and depression. Pt states that he feels out of control like he is unraveling. He is depressed and having thoughts and dreams about death, denies any HI. Pt is calm and cooperative at triage.

## 2012-07-14 NOTE — BHH Counselor (Signed)
Tamera Punt, assessment counselor at [facility], submitted Pt for admission to [facility]. Brook McNichol, [facility] confirmed bed availability. Gave clinical report to Armandina Stammer, NP who accepted Pt to Dr. Mervyn Gay, room [phone].  Harlin Rain Patsy Baltimore, LPC, [facility] Assessment Counselor

## 2012-07-15 ENCOUNTER — Encounter (HOSPITAL_COMMUNITY): Payer: Self-pay | Admitting: *Deleted

## 2012-07-15 DIAGNOSIS — F411 Generalized anxiety disorder: Secondary | ICD-10-CM

## 2012-07-15 DIAGNOSIS — F332 Major depressive disorder, recurrent severe without psychotic features: Principal | ICD-10-CM

## 2012-07-15 MED ORDER — CITALOPRAM HYDROBROMIDE 10 MG PO TABS
30.0000 mg | ORAL_TABLET | Freq: Every day | ORAL | Status: DC
  Administered 2012-07-15 – 2012-07-16 (×2): 30 mg via ORAL
  Filled 2012-07-15 (×4): qty 1

## 2012-07-15 NOTE — Progress Notes (Signed)
Patient ID: Jordan Barton, male   DOB: Mar 08, 1960, 52 y.o.   MRN: [id number] This is the first [facility] adm for this 52 yo mwm who has had recent neck surgery and states has had thoughts of death and dying.  Currently denies suicidal thoughts, but thinks may be more of an issue of confronting his mortality.  Did , however have such high anxiety yesterday at the ED that he required ativan and a sitter.  Has been calm and cooperative on arrival, was brought back to the unit and he slept for several hours , at which time admission process was finished.  Essentially, pt states he has been prescribed various meds that haven't worked and is here for a med adjustment.  Also was trying to taper his pain meds, which contributed to his anxiety and discomfort.  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  Is married with 2 children. Pleasant, cooperative, was medicated for pain and anxiety and appeared to sleep most of the night.

## 2012-07-15 NOTE — Progress Notes (Signed)
Jordan Barton shares that he is slowly becoming more comfortable with his environment. He shares that he is learning to process his feelings, to understand his depression, complicated by his pain issues.   A He completes his AM self inventory and on it he writes he denies SI wihtin the past 24 hrs, he rated his depression and hopelessness "5/5" and stated his DC plan is to exercise, see a therapist regularly and to be more sociable".   R Safety is in place and therapeutic relationship is fostered.

## 2012-07-15 NOTE — BHH Suicide Risk Assessment (Signed)
Suicide Risk Assessment  Admission Assessment     Nursing information obtained from:  Patient Demographic factors:  Male;Caucasian Current Mental Status:  NA Loss Factors:  Decline in physical health Historical Factors:  Family history of mental illness or substance abuse Risk Reduction Factors:  Sense of responsibility to family;Employed;Living with another person, especially a relative;Positive social support;Positive therapeutic relationship;Positive coping skills or problem solving skills  CLINICAL FACTORS:   Severe Anxiety and/or Agitation Panic Attacks Depression:   Anhedonia Hopelessness Impulsivity Insomnia Recent sense of peace/wellbeing Severe Chronic Pain Medical Diagnoses and Treatments/Surgeries  COGNITIVE FEATURES THAT CONTRIBUTE TO RISK:  Closed-mindedness Loss of executive function Polarized thinking    SUICIDE RISK:  Moderate:  Frequent suicidal ideation with limited intensity, and duration, some specificity in terms of plans, no associated intent, good self-control, limited dysphoria/symptomatology, some risk factors present, and identifiable protective factors, including available and accessible social support.  PLAN OF CARE: Admit emergently, voluntarily from [facility] to [facility] for depression and passive suicidal thoughts with plans of drowning and falling from a tall building without intention. Patient will participate psychiatric inpatient treatment program for crisis stabilization and medication management . Patient's safety will be monitored through out the program.  I certify that inpatient services furnished can reasonably be expected to improve the patient's condition.  Heyden Jaber,JANARDHAHA R. 07/15/2012, 3:35 PM
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
## 2012-07-15 NOTE — ED Provider Notes (Signed)
Medical screening examination/treatment/procedure(s) were performed by non-physician practitioner and as supervising physician I was immediately available for consultation/collaboration.   Carleene Cooper III, MD 07/15/12 [phone]
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
## 2012-07-15 NOTE — BHH Group Notes (Signed)
BHH LCSW Group Therapy  07/15/2012   Type of Therapy:  Group Therapy 3 to 4PM  Participation Level:  Minimal  Participation Quality:  Appropriate and Attentive  Affect:  Depressed  Cognitive:  Appropriate  Insight:  None shared as patient was out for large portion of group to meet with psychiatrist  Engagement in Therapy:  Engaged  Modes of Intervention:  Discussion, Exploration, Rapport Building, Socialization and Support  Summary of Progress/Problems: Topic for group today was readiness for change and the different stages ( Pre contemplation, Contemplation, Preparation,  Action, Maintenance and reoccurrence) were then discussed and described by group. Patient was out for large portion of group to meet with Dr. Shela Commons yet did exhibit interest in topic and was attentive during all the time he was present for group.   Clide Dales

## 2012-07-15 NOTE — Progress Notes (Signed)
.  Psychoeducational Group Note    Date: 07/15/2012 Time:  0915   Goal Setting Purpose of Group: To be able to set a goal that is measurable and that can be accomplished in one day Participation Level:  Active  Participation Quality:  Appropriate  Affect:  Appropriate  Cognitive:  Oriented  Insight:  Improving  Engagement in Group:  Engaged  Additional Comments:  Set a reasonable goal for today of staying in the present and not worring about the past or the future.  Dione Housekeeper
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
## 2012-07-15 NOTE — Progress Notes (Signed)
Psychoeducational Group Note  Date: 07/15/2012 Time:  1015  Group Topic/Focus:  Identifying Needs:   The focus of this group is to help patients identify their personal needs that have been historically problematic and identify healthy behaviors to address their needs.  Participation Level:  Active  Participation Quality:  Appropriate  Affect:  Appropriate  Cognitive:  Appropriate  Insight:  Improving  Engagement in Group:  Engaged  Additional Comments:  Found the group to be useful and that it will be applied to daily life.   Deloyd Handy A 

## 2012-07-16 MED ORDER — CLONAZEPAM 0.5 MG PO TABS
0.5000 mg | ORAL_TABLET | Freq: Two times a day (BID) | ORAL | Status: DC
  Administered 2012-07-16 – 2012-07-18 (×4): 0.5 mg via ORAL
  Filled 2012-07-16 (×4): qty 1

## 2012-07-16 MED ORDER — POLYETHYLENE GLYCOL 3350 17 G PO PACK
17.0000 g | PACK | Freq: Every day | ORAL | Status: DC
  Administered 2012-07-16 – 2012-07-18 (×2): 17 g via ORAL
  Filled 2012-07-16 (×6): qty 1

## 2012-07-16 MED ORDER — CITALOPRAM HYDROBROMIDE 40 MG PO TABS
40.0000 mg | ORAL_TABLET | Freq: Every day | ORAL | Status: DC
Start: 2012-07-17 — End: 2012-07-18
  Administered 2012-07-17 – 2012-07-18 (×2): 40 mg via ORAL
  Filled 2012-07-16 (×3): qty 1
  Filled 2012-07-16: qty 2

## 2012-07-16 NOTE — Progress Notes (Signed)
Jordan Barton is seen OOB UAL on the 500 hall today. HE is more comfortable. HE makes longer eye contact. HE  Is seen sitting in the dayroom and is noted to not look as tense and uncomfortable as he has yesterday and the day before.\    A HE attends his groups as scheduled, he takes his meds according to schedule and he is engages in his recovery as evidenced by the fact he is outwardly trying to process, to understand his disease and his pain and how the 2 things together have impacted hs life in an unhealthy way. HE shares his feelings of sadness and fear and anxiety with this nurse, he asks appropriate questions about xanax and is given education regarding its addiction potential.    R Safety is in place, POC cond and pt responding favorably.

## 2012-07-16 NOTE — Progress Notes (Signed)
Psychoeducational Group Note  Date:  07/16/2012 Time:  1015  Group Topic/Focus:  Making Healthy Choices:   The focus of this group is to help patients identify negative/unhealthy choices they were using prior to admission and identify positive/healthier coping strategies to replace them upon discharge.  Participation Level:  Active  Participation Quality:  Appropriate  Affect:  Appropriate  Cognitive:  Oriented  Insight:  Engaged and Improving  Engagement in Group:  Engaged  Additional Comments:    Joe Tanney A 07/16/2012 

## 2012-07-16 NOTE — Progress Notes (Signed)
Psychoeducational Group Note  Psychoeducational Group Note  Date: 07/16/2012 Time:  07/16/2012  Group Topic/Focus:  Gratefulness:  The focus of this group is to help patients identify what two things they are most grateful for in their lives. What helps ground them and to center them on their work to their recovery.  Participation Level:  Active  Participation Quality:  Appropriate  Affect:  Angry and Appropriate  Cognitive:  Appropriate  Insight:  Improving  Engagement in Group:  Engaged  Additional Comments:  Pt participated fully in group. States that he is grateful for his immediate and extended family and for the community in which he now lives. Dione Housekeeper

## 2012-07-16 NOTE — [facility] Group Notes (Signed)
[facility] LCSW Group Therapy  07/16/2012   3:00 PM   Type of Therapy:  Group Therapy  Participation Level:  Active  Participation Quality:  Appropriate and Attentive  Affect:  Appropriate  Cognitive:  Alert and Appropriate  Insight:  Developing/Improving and Engaged  Engagement in Therapy:  Developing/Improving and Engaged  Modes of Intervention:  Clarification, Confrontation, Discussion, Education, Exploration, Limit-setting, Orientation, Problem-solving, Rapport Building, Dance movement psychotherapist, Socialization and Support  Summary of Progress/Problems: The main focus of today's process group was to identify the patient's current support system and decide on other supports that can be put in place.  An emphasis was placed on using counselor, doctor, therapy groups, 12-step groups, and problem-specific support groups to expand supports, as well as doing something different than has been done before. Pt shared that he brought work home too much which pushed his family away.  Pt states that he plans to leave work at work and not isolate from his family so much, to utilize them as a support.  Pt expressed the importance of knowing who his supports are before he gets to the point of crisis.  Pt actively participated and was engaged in group discussion.     Reyes Ivan, LCSWA 07/16/2012 1:54 PM

## 2012-07-16 NOTE — Progress Notes (Signed)
[facility] MD Progress Note  07/16/2012 2:39 PM Jordan Barton  MRN:  [id number] Subjective:  Patient is seen today. He has slow start of his day and body pain which are attributed to his pain medication. He stated that he has slept better in days and feels rested. He stated that he is taking his prescribed medications and has no side effects. He is positively responding to his medications. He rated his depression 5/10 and anxiety 4/10 and suicidal thoughts on and off and contract for safety in hospital.  Diagnosis:  Axis I: Major Depression, single episode  ADL's:  Intact  Sleep: Fair  Appetite:  Fair  Suicidal Ideation:  Patient has suicidal ideation without intent and plan Homicidal Ideation:  none AEB (as evidenced by):  Psychiatric Specialty Exam: ROS  Blood pressure 120/75, pulse 84, temperature 97.5 F (36.4 C), temperature source Oral, resp. rate 18, height 6\' 5"  (1.956 m), weight 117.935 kg (260 lb).Body mass index is 30.83 kg/(m^2).  General Appearance: Casual and Neat  Eye Contact::  Good  Speech:  Clear and Coherent  Volume:  Decreased  Mood:  Anxious and Depressed  Affect:  Congruent and Depressed  Thought Process:  Goal Directed and Linear  Orientation:  Full (Time, Place, and Person)  Thought Content:  WDL  Suicidal Thoughts:  Yes.  without intent/plan  Homicidal Thoughts:  No  Memory:  Immediate;   Fair Recent;   Fair  Judgement:  Intact  Insight:  Present  Psychomotor Activity:  Psychomotor Retardation  Concentration:  Fair  Recall:  Good  Akathisia:  NA  Handed:  Right  AIMS (if indicated):     Assets:  Communication Skills Desire for Improvement Financial Resources/Insurance Housing Resilience Social Support Transportation  Sleep:  Number of Hours: 5.75   Current Medications: Current Facility-Administered Medications  Medication Dose Route Frequency Provider Last Rate Last Dose  . acetaminophen (TYLENOL) tablet 650 mg  650 mg Oral Q6H PRN Sanjuana Kava, NP      . alum & mag hydroxide-simeth (MAALOX/MYLANTA) 200-200-20 MG/5ML suspension 30 mL  30 mL Oral Q4H PRN Sanjuana Kava, NP      . atorvastatin (LIPITOR) tablet 10 mg  10 mg Oral q1800 Sanjuana Kava, NP   10 mg at 07/15/12 1732  . [START ON 07/17/2012] citalopram (CELEXA) tablet 40 mg  40 mg Oral Daily Nehemiah Settle, MD      . clonazePAM Scarlette Calico) tablet 0.5 mg  0.5 mg Oral BID Nehemiah Settle, MD      . HYDROcodone-acetaminophen [facility]) 10-325 MG per tablet 1 tablet  1 tablet Oral Q6H PRN Sanjuana Kava, NP   1 tablet at 07/16/12 0824  . magnesium hydroxide (MILK OF MAGNESIA) suspension 30 mL  30 mL Oral Daily PRN Sanjuana Kava, NP   30 mL at 07/15/12 0039  . pantoprazole (PROTONIX) EC tablet 40 mg  40 mg Oral Daily Sanjuana Kava, NP   40 mg at 07/16/12 1324  . polyethylene glycol (MIRALAX / GLYCOLAX) packet 17 g  17 g Oral Daily Nehemiah Settle, MD      . traZODone (DESYREL) tablet 50 mg  50 mg Oral QHS PRN,MR X 1 Sanjuana Kava, NP   50 mg at 07/15/12 2239    Lab Results:  Results for orders placed during the hospital encounter of 07/14/12 (from the past 48 hour(s))  URINE RAPID DRUG SCREEN (HOSP PERFORMED)     Status: Abnormal   Collection Time  07/14/12  3:20 PM      Result Value Range   Opiates POSITIVE (*) NONE DETECTED   Cocaine NONE DETECTED  NONE DETECTED   Benzodiazepines POSITIVE (*) NONE DETECTED   Amphetamines NONE DETECTED  NONE DETECTED   Tetrahydrocannabinol NONE DETECTED  NONE DETECTED   Barbiturates NONE DETECTED  NONE DETECTED   Comment:            DRUG SCREEN FOR MEDICAL PURPOSES     ONLY.  IF CONFIRMATION IS NEEDED     FOR ANY PURPOSE, NOTIFY LAB     WITHIN 5 DAYS.                LOWEST DETECTABLE LIMITS     FOR URINE DRUG SCREEN     Drug Class       Cutoff (ng/mL)     Amphetamine      1000     Barbiturate      200     Benzodiazepine   200     Tricyclics       300     Opiates          300     Cocaine          300      THC              50  ACETAMINOPHEN LEVEL     Status: None   Collection Time    07/14/12  3:25 PM      Result Value Range   Acetaminophen (Tylenol), Serum <15.0  10 - 30 ug/mL   Comment:            THERAPEUTIC CONCENTRATIONS VARY     SIGNIFICANTLY. A RANGE OF 10-30     ug/mL MAY BE AN EFFECTIVE     CONCENTRATION FOR MANY PATIENTS.     HOWEVER, SOME ARE BEST TREATED     AT CONCENTRATIONS OUTSIDE THIS     RANGE.     ACETAMINOPHEN CONCENTRATIONS     >150 ug/mL AT 4 HOURS AFTER     INGESTION AND >50 ug/mL AT 12     HOURS AFTER INGESTION ARE     OFTEN ASSOCIATED WITH TOXIC     REACTIONS.  CBC     Status: None   Collection Time    07/14/12  3:25 PM      Result Value Range   WBC 6.5  4.0 - 10.5 K/uL   RBC 4.84  4.22 - 5.81 MIL/uL   Hemoglobin 15.3  13.0 - 17.0 g/dL   HCT 47.8  29.5 - 62.1 %   MCV 88.6  78.0 - 100.0 fL   MCH 31.6  26.0 - 34.0 pg   MCHC 35.7  30.0 - 36.0 g/dL   RDW 30.8  65.7 - 84.6 %   Platelets 197  150 - 400 K/uL  COMPREHENSIVE METABOLIC PANEL     Status: Abnormal   Collection Time    07/14/12  3:25 PM      Result Value Range   Sodium 138  135 - 145 mEq/L   Potassium 3.8  3.5 - 5.1 mEq/L   Chloride 100  96 - 112 mEq/L   CO2 28  19 - 32 mEq/L   Glucose, Bld 86  70 - 99 mg/dL   BUN 16  6 - 23 mg/dL   Creatinine, Ser 9.62  0.50 - 1.35 mg/dL   Calcium 9.7  8.4 - 95.2 mg/dL   Total  Protein 7.7  6.0 - 8.3 g/dL   Albumin 4.2  3.5 - 5.2 g/dL   AST 18  0 - 37 U/L   ALT 24  0 - 53 U/L   Alkaline Phosphatase 75  39 - 117 U/L   Total Bilirubin 0.7  0.3 - 1.2 mg/dL   GFR calc non Af Amer 81 (*) >90 mL/min   GFR calc Af Amer >90  >90 mL/min   Comment:            The eGFR has been calculated     using the CKD EPI equation.     This calculation has not been     validated in all clinical     situations.     eGFR's persistently     <90 mL/min signify     possible Chronic Kidney Disease.  ETHANOL     Status: None   Collection Time    07/14/12  3:25 PM       Result Value Range   Alcohol, Ethyl (B) <11  0 - 11 mg/dL   Comment:            LOWEST DETECTABLE LIMIT FOR     SERUM ALCOHOL IS 11 mg/dL     FOR MEDICAL PURPOSES ONLY  SALICYLATE LEVEL     Status: Abnormal   Collection Time    07/14/12  3:25 PM      Result Value Range   Salicylate Lvl <2.0 (*) 2.8 - 20.0 mg/dL    Physical Findings: AIMS: Facial and Oral Movements Muscles of Facial Expression: None, normal Lips and Perioral Area: None, normal Jaw: None, normal Tongue: None, normal,Extremity Movements Upper (arms, wrists, hands, fingers): None, normal Lower (legs, knees, ankles, toes): None, normal, Trunk Movements Neck, shoulders, hips: None, normal, Overall Severity Severity of abnormal movements (highest score from questions above): None, normal Incapacitation due to abnormal movements: None, normal Patient's awareness of abnormal movements (rate only patient's report): No Awareness, Dental Status Current problems with teeth and/or dentures?: No Does patient usually wear dentures?: No  CIWA:    COWS:     Treatment Plan Summary: Daily contact with patient to assess and evaluate symptoms and progress in treatment Medication management  Plan: 1. Increase Celexa 40 mg i PO BID 2. Discontinue Xanax 3. Start Klonopin 0.5 mg PO BID 4. Start Miralax for constipation 5. Continue other medication for pain, GERD, and cholesterol 6. Encourage unit activities and group participation 7. Disposition plans in progress.    Medical Decision Making Problem Points:  Established problem, worsening (2), Review of last therapy session (1) and Review of psycho-social stressors (1) Data Points:  Review or order clinical lab tests (1) Review of medication regiment & side effects (2) Review of new medications or change in dosage (2)  I certify that inpatient services furnished can reasonably be expected to improve the patient's condition.   Galileo Colello,JANARDHAHA R. 07/16/2012, 2:39  PM

## 2012-07-16 NOTE — Progress Notes (Signed)
Patient ID: Jordan Barton, male   DOB: [birth year], 52 y.o.   MRN: [id number] D)   Has been out and about on the unit this evening, interacting with select peers, attended group.  States feeling a little better,  Gaining some insight, learning to process his feelings. A)   Support, continue to monitor for safety, continue POC R)  Receptive, appreciative, safety maintained.

## 2012-07-17 DIAGNOSIS — F329 Major depressive disorder, single episode, unspecified: Secondary | ICD-10-CM

## 2012-07-17 MED ORDER — MAGNESIUM CITRATE PO SOLN
0.5000 | Freq: Once | ORAL | Status: AC
  Administered 2012-07-17: 0.5 via ORAL

## 2012-07-17 NOTE — Progress Notes (Signed)
Patient ID: Jordan Barton, male   DOB: 10/09/1960, 52 y.o.   MRN: [id number] D)  Has been going to groups and feeling that he has been gaining considerable insight.  Pleasant, cooperative, appropriate.  States the ice packs are helping his neck discomfort, and has been trying to stretch out the time on when he takes his pain meds.  Feels he is making progress, things are starting to fall into place again.  Denies thoughts of self harm. AWill continue to monitor for safety, continue POC, support R)  Safety maintained, appreciative.

## 2012-07-17 NOTE — Progress Notes (Signed)
Adult Psychoeducational Group Note  Date:  07/16/2012  Time:  20:00  Group Topic/Focus:  Wrap-Up Group:   The focus of this group is to help patients review their daily goal of treatment and discuss progress on daily workbooks.  Participation Level:  Active  Participation Quality:  Appropriate  Affect:  Appropriate  Cognitive:  Appropriate  Insight: Good  Engagement in Group:  Engaged  Modes of Intervention:  Discussion  Additional Comments:  Patient goal was to learn more today about groups and how it help with his everyday lifestyle. Patient was excited to say he met his goal and will continue to attend groups on the outside when needed.  Casilda Carls 07/17/2012, 3:46 AM

## 2012-07-17 NOTE — Progress Notes (Signed)
Adult Psychoeducational Group Note  Date:  07/17/2012 Time:  3:35 PM  Group Topic/Focus:  Self Care:   The focus of this group is to help patients understand the importance of self-care in order to improve or restore emotional, physical, spiritual, interpersonal, and financial health.  Participation Level:  Active  Participation Quality:  Appropriate, Attentive and Sharing  Affect:  Appropriate  Cognitive:  Appropriate  Insight: Appropriate and Good  Engagement in Group:  Developing/Improving and Engaged  Modes of Intervention:  Discussion, Education, Socialization and Support  Additional Comments:  Jordan Barton attended and shared during group. Patient define self-care in her own terms. Patient was asked to complete the self care assessment in workbook for daily theme, patient completed assessment and rated the areas of care of physical, psychological, emotional, spiritual, relationship care and was asked to give weaknesses and strengths in the areas. Patient also expressed ways to improve care and asked to set a goal for the areas of weaknesses.   Jordan Barton Jordan Barton 07/17/2012, 3:35 PM

## 2012-07-17 NOTE — [facility] Suicide Risk Assessment (Signed)
[facility] INPATIENT:  Family/Significant Other Suicide Prevention Education  Suicide Prevention Education:  Education Completed; Jordan Barton, Wife, [phone] has been identified by the patient as the family member/significant other with whom the patient will be residing, and identified as the person(s) who will aid the patient in the event of a mental health crisis (suicidal ideations/suicide attempt).  With written consent from the patient, the family member/significant other has been provided the following suicide prevention education, prior to the and/or following the discharge of the patient.  The suicide prevention education provided includes the following:  Suicide risk factors  Suicide prevention and interventions  National Suicide Hotline telephone number  [facility] assessment telephone number  [facility] Emergency Assistance 911  [facility] and/or Residential Mobile Crisis Unit telephone number  Request made of family/significant other to:  Remove weapons (e.g., guns, rifles, knives), all items previously/currently identified as safety concern.  Wife advised patient does not have access.  Remove drugs/medications (over-the-counter, prescriptions, illicit drugs), all items previously/currently identified as a safety concern.  The family member/significant other verbalizes understanding of the suicide prevention education information provided.  The family member/significant other agrees to remove the items of safety concern listed above.  Wynn Banker 07/17/2012, 3:36 PM

## 2012-07-17 NOTE — Progress Notes (Signed)
Grief and Loss Group   Group members processed their feelings and experiences with significant losses in their lives.   Pt was active in talking during the group and sharing his own experience with the loss of his nephew who died in a car accident. Pt talked about the impact this loss has had on him and the way that his family comes together every year to plant a tree in his nephew's honor and reminisce about his memory. Pt talked about the ways that he found support in this yearly ritual including finding support from his family and actively engaging in this activity instead of ruminating on the negative emotions surrounding it. Pt provided gentle encouragement to other members throughout group.  Sherol Dade  Counselor Intern  Haroldine Laws

## 2012-07-17 NOTE — [facility] Group Notes (Signed)
[facility] LCSW Aftercare Discharge Planning Group Note   07/17/2012 12:13 PM  Participation Quality:  Appropriate  Mood/Affect:  Appropriate  Depression Rating:  0  Anxiety Rating:  1  Thoughts of Suicide:  No  Will you contract for safety?   NA  Current AVH:  Yes  Plan for Discharge/Comments:  Patient reports being better today.  He reports admitted due to SI and anxiety but did not act on thoughts.  He has home, transportation and access to meds.  He denies ETOH/Drugs.  Patient will need assistance with outpatient follow up.  Transportation Means: Patient has transportation.   Supports:  Patient has a good support system.   Hether Anselmo, Joesph July

## 2012-07-17 NOTE — [facility] Group Notes (Signed)
[facility] LCSW Group Therapy          Overcoming Obstacles       1:15 -2:30        07/17/2012   3:33 PM     Type of Therapy:  Group Therapy  Participation Level:  Appropriate  Participation Quality:  Appropriate  Affect:  Appropriate, Alert  Cognitive:  Attentive Appropriate  Insight: Developing/Improving Engaged  Engagement in Therapy: Developing/Imprvoing Engaged  Modes of Intervention:  Discussion Exploration  Education Rapport BuildingProblem-Solving Support  Summary of Progress/Problems:  The main focus of today's group was overcoming  Obstacles.  He shared pride is the obstacle he needs to overcome.  Patient stated he spent 23 years in the [employer] and has traveled around the world. 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  Patient stated he needs to reduce his work scheduled, stop bringing work home and take more personal time.  Wynn Banker 07/17/2012    3:33 PM

## 2012-07-17 NOTE — Progress Notes (Signed)
D: Patient states he  is doing well.  Patient states he has been attending all of his groups and patient states his wife visited him today and patient states it was a good visit.  Patient states he needs to learn to balance his work life and his personal life.  Patient states he often brings his work home and states it is ruining his family life.  Patient denies SI/HI and denies AVH. A: Staff to monitor Q 15 mins for safety.  Encouragement and support offered.  No scheduled medications administered per orders.  Trazodone administered prn for sleep. R: Patient remains safe on the unit.  Patient attended group tonight.  Patient calm, cooperative and taking administered medications.  Patient visible on the unit and interacting with peers.

## 2012-07-17 NOTE — Progress Notes (Signed)
Patient ID: Jordan Barton, male   DOB: 1960/01/29, 52 y.o.   MRN: [id number] [facility] MD Progress Note  07/17/2012 1:50 PM Jordan Barton  MRN:  [id number] Subjective:   Patient reports that he is doing much better but is still feeling tired. He denies having any panic attacks since being in the hospital and feels his recent medication adjustments have been helpful. Patient plans on addressing his stressors after leaving the hospital "By taking some time off work and also not bringing my work home. I think taking the pressure off some will help my symptoms of anxiety. Patient feels that he is still getting used to the medication adjustments as xanax was recently d/ced and klonopin added.   Diagnosis:  Axis I: Major Depression, single episode  ADL's:  Intact  Sleep: Fair  Appetite:  Fair  Suicidal Ideation:  Patient has suicidal ideation without intent and plan Homicidal Ideation:  none AEB (as evidenced by):  Psychiatric Specialty Exam: Review of Systems  Constitutional: Negative.   HENT: Negative.   Eyes: Negative.   Respiratory: Negative.   Cardiovascular: Negative.   Gastrointestinal: Positive for constipation.  Genitourinary: Negative.   Musculoskeletal: Negative.   Skin: Negative.   Neurological: Negative.   Endo/Heme/Allergies: Negative.   Psychiatric/Behavioral: Positive for depression. Negative for suicidal ideas, hallucinations, memory loss and substance abuse. The patient is nervous/anxious. The patient does not have insomnia.     Blood pressure 113/77, pulse 87, temperature 97.7 F (36.5 C), temperature source Oral, resp. rate 20, height 6\' 5"  (1.956 m), weight 117.935 kg (260 lb).Body mass index is 30.83 kg/(m^2).  General Appearance: Casual and Neat  Eye Contact::  Good  Speech:  Clear and Coherent  Volume:  Decreased  Mood:  Anxious and Depressed  Affect:  Congruent and Depressed  Thought Process:  Goal Directed and Linear  Orientation:  Full (Time, Place, and  Person)  Thought Content:  WDL  Suicidal Thoughts:  Yes.  without intent/plan  Homicidal Thoughts:  No  Memory:  Immediate;   Fair Recent;   Fair  Judgement:  Intact  Insight:  Present  Psychomotor Activity:  Psychomotor Retardation  Concentration:  Fair  Recall:  Good  Akathisia:  NA  Handed:  Right  AIMS (if indicated):     Assets:  Communication Skills Desire for Improvement Financial Resources/Insurance Housing Resilience Social Support Transportation  Sleep:  Number of Hours: 5.75   Current Medications: Current Facility-Administered Medications  Medication Dose Route Frequency Provider Last Rate Last Dose  . acetaminophen (TYLENOL) tablet 650 mg  650 mg Oral Q6H PRN Sanjuana Kava, NP      . alum & mag hydroxide-simeth (MAALOX/MYLANTA) 200-200-20 MG/5ML suspension 30 mL  30 mL Oral Q4H PRN Sanjuana Kava, NP      . atorvastatin (LIPITOR) tablet 10 mg  10 mg Oral q1800 Sanjuana Kava, NP   10 mg at 07/16/12 1710  . citalopram (CELEXA) tablet 40 mg  40 mg Oral Daily Nehemiah Settle, MD   40 mg at 07/17/12 0818  . clonazePAM (KLONOPIN) tablet 0.5 mg  0.5 mg Oral BID Nehemiah Settle, MD   0.5 mg at 07/17/12 0818  . HYDROcodone-acetaminophen (NORCO) 10-325 MG per tablet 1 tablet  1 tablet Oral Q6H PRN Sanjuana Kava, NP   1 tablet at 07/17/12 0819  . magnesium hydroxide (MILK OF MAGNESIA) suspension 30 mL  30 mL Oral Daily PRN Sanjuana Kava, NP   30 mL at 07/15/12 0039  .  pantoprazole (PROTONIX) EC tablet 40 mg  40 mg Oral Daily Sanjuana Kava, NP   40 mg at 07/17/12 0818  . polyethylene glycol (MIRALAX / GLYCOLAX) packet 17 g  17 g Oral Daily Nehemiah Settle, MD   17 g at 07/16/12 2139  . traZODone (DESYREL) tablet 50 mg  50 mg Oral QHS PRN,MR X 1 Sanjuana Kava, NP   50 mg at 07/16/12 2216    Lab Results:  No results found for this or any previous visit (from the past 48 hour(s)).  Physical Findings: AIMS: Facial and Oral Movements Muscles of  Facial Expression: None, normal Lips and Perioral Area: None, normal Jaw: None, normal Tongue: None, normal,Extremity Movements Upper (arms, wrists, hands, fingers): None, normal Lower (legs, knees, ankles, toes): None, normal, Trunk Movements Neck, shoulders, hips: None, normal, Overall Severity Severity of abnormal movements (highest score from questions above): None, normal Incapacitation due to abnormal movements: None, normal Patient's awareness of abnormal movements (rate only patient's report): No Awareness, Dental Status Current problems with teeth and/or dentures?: No Does patient usually wear dentures?: No  CIWA:    COWS:     Treatment Plan Summary: Daily contact with patient to assess and evaluate symptoms and progress in treatment Medication management  Plan: Continue crisis management and stabilization.  Medication management: Reviewed with patient who stated no untoward effects. Recent increase in celexa over the weekend.  Encouraged patient to attend groups and participate in group counseling sessions and activities.  Discharge plan in progress.  Continue current treatment plan.   Medical Decision Making Problem Points:  Established problem, worsening (2), Review of last therapy session (1) and Review of psycho-social stressors (1) Data Points:  Review or order clinical lab tests (1) Review of medication regiment & side effects (2) Review of new medications or change in dosage (2)  I certify that inpatient services furnished can reasonably be expected to improve the patient's condition.   [name] NP-C 07/17/2012, 1:50 PM

## 2012-07-17 NOTE — Progress Notes (Signed)
Recreation Therapy Notes  Date: 06.23.2014        Time: 3:00pm Location: [address] Dayroom      Group Topic/Focus: Self Expression  Participation Level: Active  Participation Quality: Appropriate  Affect: Euthymic  Cognitive: Appropriate   Additional Comments: Activity: Colors within Me; Explanation: Patients were asked to assign a color to a specific emotion. Using the colors they identified there were asked to color a blank face to represent the emotions most present in them. Classical music was played to enhance therapeutic environment.  Patient actively participated in group activity. Patient used his worksheet to represent primarily positive emotions, patient explained that he is expecting d/c soon and it is effecting his mood in a positive way. patient explained to LRT he originally thought he would d/c today, however his LCSW wanted to make sure he had the proper aftercare services arranged. Patient was appreciative of staff wanting to make sure he got what he needs. Patient contributed to the wrap up discussion about the importance of being in touch with your emotions.   Marykay Lex Pharell Rolfson, LRT/CTRS  Jearl Klinefelter 07/17/2012 4:16 PM

## 2012-07-17 NOTE — Progress Notes (Signed)
D:  Per pt self inventory pt reports sleeping well, appetite good, ability to pay attention good, rates depression at a 1 or 2 out of 10 and hopelessness at a 1 out of 10.  Denies SI/HI/AVH.     A:  Emotional support provided, Encouraged pt to continue with treatment plan and attend all group activities, q15 min checks maintained for safety.  R:  Pt is calm cooperative and pleasant with staff and other patients, pt is attending groups.

## 2012-07-18 MED ORDER — CITALOPRAM HYDROBROMIDE 40 MG PO TABS: 40.0000 mg | ORAL_TABLET | Freq: Every day | ORAL | Status: AC

## 2012-07-18 MED ORDER — POLYETHYLENE GLYCOL 3350 17 G PO PACK: 17.0000 g | PACK | Freq: Every day | ORAL | Status: AC

## 2012-07-18 MED ORDER — HYDROCODONE-ACETAMINOPHEN 10-325 MG PO TABS: 1.0000 | ORAL_TABLET | Freq: Four times a day (QID) | ORAL | Status: AC | PRN

## 2012-07-18 MED ORDER — CLONAZEPAM 0.5 MG PO TABS: 0.5000 mg | ORAL_TABLET | Freq: Two times a day (BID) | ORAL | Status: AC

## 2012-07-18 MED ORDER — ESOMEPRAZOLE MAGNESIUM 20 MG PO CPDR: 20.0000 mg | DELAYED_RELEASE_CAPSULE | Freq: Every day | ORAL | Status: AC | PRN

## 2012-07-18 MED ORDER — CLONAZEPAM 0.5 MG PO TABS: 0.5000 mg | ORAL_TABLET | Freq: Two times a day (BID) | ORAL | Status: DC

## 2012-07-18 MED ORDER — ATORVASTATIN CALCIUM 10 MG PO TABS: 10.0000 mg | ORAL_TABLET | Freq: Every day | ORAL | Status: AC

## 2012-07-18 MED ORDER — TRAZODONE HCL 50 MG PO TABS: 50.0000 mg | ORAL_TABLET | Freq: Every evening | ORAL | Status: AC | PRN

## 2012-07-18 MED ORDER — POLYETHYLENE GLYCOL 3350 17 G PO PACK: 17.0000 g | PACK | Freq: Every day | ORAL | Status: DC

## 2012-07-18 NOTE — [facility] Group Notes (Signed)
[facility] LCSW Group Therapy      Feelings About Diagnosis 1:15 - 2:30 PM         07/18/2012  2:55 PM    Type of Therapy:  Group Therapy  Participation Level:  Active  Participation Quality:  Appropriate  Affect:  Appropriate  Cognitive:  Alert and Appropriate  Insight:  Developing/Improving and Engaged  Engagement in Therapy:  Developing/Improving and Engaged  Modes of Intervention:  Discussion, Education, Exploration, Problem-Solving, Rapport Building, Support  Summary of Progress/Problems:  Patient actively participated in group. Patient discussed past and present diagnosis and the effects it has had on  life.  Patient talked about family and society being judgmental and the stigma associated with having a mental health diagnosis.  He shared admitting to the hospital has helped him acknowledge there is a problem and this has given him room to breath.  Wynn Banker 07/18/2012  2:55 PM

## 2012-07-18 NOTE — Progress Notes (Signed)
Adult Psychoeducational Group Note  Date:  07/18/2012 Time:  11:39 AM  Group Topic/Focus:  Recovery Goals:   The focus of this group is to identify appropriate goals for recovery and establish a plan to achieve them.  Participation Level:  Active  Participation Quality:  Appropriate, Attentive and Sharing  Affect:  Appropriate  Cognitive:  Appropriate  Insight: Appropriate  Engagement in Group:  Engaged  Modes of Intervention:  Discussion  Additional Comments:  Pt was appropriate and sharing while attending group. Pt shared that recovery was embracing new challenges. Pt plans to create balance in his life and getting back to enjoying his hobbies.   Sharyn Lull 07/18/2012, 11:39 AM

## 2012-07-18 NOTE — [facility] Suicide Risk Assessment (Signed)
Suicide Risk Assessment  Discharge Assessment     Demographic Factors:  Adolescent or young adult, Caucasian and Low socioeconomic status  Mental Status Per Nursing Assessment::   On Admission:  NA  Current Mental Status by Physician: Mental Status Examination: Patient appeared as per his stated age, casually dressed, and fairly groomed, and maintaining good eye contact. Patient has good mood and his affect was constricted. He has normal rate, rhythm, and volume of speech. His thought process is linear and goal directed. Patient has denied suicidal, homicidal ideations, intentions or plans. Patient has no evidence of auditory or visual hallucinations, delusions, and paranoia. Patient has fair insight judgment and impulse control.  Loss Factors: Decrease in vocational status and Decline in physical health  Historical Factors: Family history of mental illness or substance abuse  Risk Reduction Factors:   Sense of responsibility to family, Religious beliefs about death, Employed, Living with another person, especially a relative, Positive social support, Positive therapeutic relationship and Positive coping skills or problem solving skills  Continued Clinical Symptoms:  Depression:   Recent sense of peace/wellbeing Previous Psychiatric Diagnoses and Treatments Medical Diagnoses and Treatments/Surgeries  Cognitive Features That Contribute To Risk:  Polarized thinking    Suicide Risk:  Minimal: No identifiable suicidal ideation.  Patients presenting with no risk factors but with morbid ruminations; may be classified as minimal risk based on the severity of the depressive symptoms  Discharge Diagnoses:   AXIS I:  Major Depression, Recurrent severe AXIS II:  Deferred AXIS III:   Past Medical History  Diagnosis Date  . Hyperlipemia     sees Elizabeth Palau, NP [phone]  . GERD (gastroesophageal reflux disease)   . Anxiety   . Headache(784.0)     hx of  . Chronic neck pain      hx of  . Glaucoma     seen q 6months by Dr. Hazle Quant   AXIS IV:  occupational problems, other psychosocial or environmental problems, problems related to social environment and problems with primary support group AXIS V:  61-70 mild symptoms  Plan Of Care/Follow-up recommendations:  Activity:  as tolerated Diet:  Regular  Is patient on multiple antipsychotic therapies at discharge:  No   Has Patient had three or more failed trials of antipsychotic monotherapy by history:  No  Recommended Plan for Multiple Antipsychotic Therapies: Not applicable  Rocio Roam,JANARDHAHA R. 07/18/2012, 10:09 AM

## 2012-07-18 NOTE — Discharge Summary (Signed)
Physician Discharge Summary Note  Patient:  Jordan Barton is an 52 y.o., male MRN:  [id number] DOB:  [birth year] Patient phone:  [phone] (home)  Patient address:   [address] [place] [address],   Date of Admission:  07/14/2012 Date of Discharge: 07/18/12  Reason for Admission:  Panic attacks and Depression with SI  Discharge Diagnoses: Active Problems:   * No active hospital problems. *  Review of Systems  Constitutional: Negative.   HENT: Negative.   Eyes: Negative.   Respiratory: Negative.   Cardiovascular: Negative.   Gastrointestinal: Negative.   Genitourinary: Negative.   Musculoskeletal: Negative.   Skin: Negative.   Neurological: Negative.   Endo/Heme/Allergies: Negative.   Psychiatric/Behavioral: Negative for depression, suicidal ideas, hallucinations, memory loss and substance abuse. The patient is nervous/anxious. The patient does not have insomnia.    Axis Diagnosis:   AXIS I:  Major Depression, Recurrent severe AXIS II:  Deferred AXIS III:   Past Medical History  Diagnosis Date  . Hyperlipemia     sees Elizabeth Palau, NP [phone]  . GERD (gastroesophageal reflux disease)   . Anxiety   . Headache(784.0)     hx of  . Chronic neck pain     hx of  . Glaucoma     seen q 6months by Dr. Hazle Quant   AXIS IV:  occupational problems, other psychosocial or environmental problems, problems related to social environment and problems with primary support group AXIS V:  61-70 mild symptoms  Level of Care:  OP  Hospital Course:  Jordan Barton is a 52 year old male who was referred by his PCP due to worsening symptome of depression and anxiety. Patient had been having panic attacks at least three times per week and began to fear also that he might act on thoughts of self harm. The patient reported withdrawing from his family and had to leave work early one day as a result of his symptoms.       The duration of stay was four days. The patient was  seen and evaluated by the Treatment team consisting of Psychiatrist, NP-C, RN, Case Manager, and Therapist for evaluation and treatment plan with goal of stabilization upon discharge. The patient's physical and mental health problems were identified and treated appropriately. His medications for his high cholesterol and norco prn for his neck pain were reordered on admission.       Multiple modalities of treatment were used including medication, individual and group therapies, unit programming, improved nutrition, physical activity, and family sessions as needed. The patient's medications were managed by the MD.      The symptoms of depression and anxiety were monitored daily by evaluation by clinical provider.  The patient's mental and emotional status was evaluated by a daily self inventory completed by the patient. [name] reported a recent negative experience with Cymbalta and wanted to try another antidepressant. He was started on Celexa 40 mg daily and on Klonopin 0.5 mg twice daily. The patient had been using xanax 0.5 mg three times a day as needed but felt this was not managing his anxiety.       Improvement was demonstrated by declining numbers on the self assessment, improving vital signs, increased cognition, and improvement in mood, sleep, appetite as well as a reduction in physical symptoms. The patient did not report any panic attacks while in the hospital and appeared much calmer than on admission. He began to rate his symptoms very low and requested to be  discharged. [name] felt that his current medication regimen was helping him.      The patient was evaluated and found to be stable enough for discharge and was released to home per the initial plan of treatment. [name] denied any suicidal thoughts on the day of discharge. He received prescriptions for his new medications and sample medications. Patient planned to focus less on work and spend more time with his family. He reported excitement  over the fact that his twin children would be going off to college in the fall of this year.   Mental Status Exam:  For mental status exam please see mental status exam and  suicide risk assessment completed by attending physician prior to discharge.  Consults:  None  Significant Diagnostic Studies:  labs: Chem profile, CBC, UDS  Discharge Vitals:   Blood pressure 99/68, pulse 85, temperature 98.2 F (36.8 C), temperature source Oral, resp. rate 20, height 6\' 5"  (1.956 m), weight 117.935 kg (260 lb). Body mass index is 30.83 kg/(m^2). Lab Results:   No results found for this or any previous visit (from the past 72 hour(s)).  Physical Findings: AIMS: Facial and Oral Movements Muscles of Facial Expression: None, normal Lips and Perioral Area: None, normal Jaw: None, normal Tongue: None, normal,Extremity Movements Upper (arms, wrists, hands, fingers): None, normal Lower (legs, knees, ankles, toes): None, normal, Trunk Movements Neck, shoulders, hips: None, normal, Overall Severity Severity of abnormal movements (highest score from questions above): None, normal Incapacitation due to abnormal movements: None, normal Patient's awareness of abnormal movements (rate only patient's report): No Awareness, Dental Status Current problems with teeth and/or dentures?: No Does patient usually wear dentures?: No  CIWA:    COWS:     Psychiatric Specialty Exam: See Psychiatric Specialty Exam and Suicide Risk Assessment completed by Attending Physician prior to discharge.  Discharge destination:  Home  Is patient on multiple antipsychotic therapies at discharge:  No   Has Patient had three or more failed trials of antipsychotic monotherapy by history:  No  Recommended Plan for Multiple Antipsychotic Therapies: N/A  Discharge Orders   Future Orders Complete By Expires     Activity as tolerated - No restrictions  As directed         Medication List    STOP taking these medications        ALPRAZolam 0.5 MG tablet  Commonly known as:  XANAX     zolpidem 12.5 MG CR tablet  Commonly known as:  AMBIEN CR      TAKE these medications     Indication   atorvastatin 10 MG tablet  Commonly known as:  LIPITOR  Take 1 tablet (10 mg total) by mouth daily.   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 For depression.   Indication:  Depression     clonazePAM 0.5 MG tablet  Commonly known as:  KLONOPIN  Take 1 tablet (0.5 mg total) by mouth 2 (two) times daily.      esomeprazole 20 MG capsule  Commonly known as:  NEXIUM  Take 1 capsule (20 mg total) by mouth daily as needed (for acid reflux).   Indication:  Gastroesophageal Reflux Disease with Current Symptoms     HYDROcodone-acetaminophen 10-325 MG per tablet  Commonly known as:  NORCO  Take 1 tablet by mouth every 6 (six)  hours as needed for pain.   Indication:  Moderate to Moderately Severe Pain     ondansetron 4 MG tablet  Commonly known as:  ZOFRAN  Take 1 tablet (4 mg total) by mouth every 8 (eight) hours as needed for nausea.      polyethylene glycol packet  Commonly known as:  MIRALAX / GLYCOLAX  Take 17 g by mouth daily.      traZODone 50 MG tablet  Commonly known as:  DESYREL  Take 1 tablet (50 mg total) by mouth at bedtime as needed and may repeat dose one time if needed for sleep.   Indication:  Trouble Sleeping, Major Depressive Disorder         Follow-up recommendations:  Activity:  As tolerated Diet:  Regular  Comments:  Take all your medications as prescribed by your mental healthcare provider.  Report any adverse effects and or reactions from your medicines to your outpatient provider promptly.  Patient is instructed and cautioned to not engage in alcohol and or illegal drug use while on prescription medicines.  In the event of worsening symptoms, patient is  instructed to call the crisis hotline, 911 and or go to the nearest ED for appropriate evaluation and treatment of symptoms.  Follow-up with your primary care provider for your other medical issues, concerns and or health care needs.   Total Discharge Time:  Greater than 30 minutes.  SignedFransisca Kaufmann NP-C 07/18/2012, 10:07 AM  Patient is personally seen, case discussed with treatment team and evaluation completed and developed care plan. Reviewed the information documented and agree with the treatment plan.   Lirio Bach,JANARDHAHA R. 07/18/2012 7:29 PM

## 2012-07-18 NOTE — Progress Notes (Signed)
[facility] Adult Case Management Discharge Plan :  Will you be returning to the same living situation after discharge: Yes, patient is returning home. At discharge, do you have transportation home?  Yes, patient has transportation. Do you have the ability to pay for your medications?  Yes, patient is able to obtain medications.  Release of information consent forms completed and in the chart;  Patient's signature needed at discharge.  Patient to Follow up at: Follow-up Information   Follow up with Leanor Rubenstein [facility] On 07/25/2012. Leanor Rubenstein on Tuesday, July 25, 2012 at 10:30 )    Contact information:   [address] [place], [place]   [address]  [phone]      Patient denies SI/HI:   Patient no longer endorsing SI/HI or other thoughts of self harm.   Safety Planning and Suicide Prevention discussed: .Reviewed with all patients during discharge planning group   Jordan Barton, [place] July 07/18/2012, 2:57 PM

## 2012-07-18 NOTE — Progress Notes (Signed)
Recreation Therapy Notes  Date: 06.24.2014 Time: 2:45pm Location: [address] Dayroom     Group Topic/Focus: Musician (AAA/T)  Participation Level: Active  Participation Quality: Appropriate  Affect: Euthymic  Cognitive: Appropriate  Additional Comments: 06.24.2014 Session = AAA Session; Dog Team = [facility] and handler  Patient with peers educated on search and rescue. Patient pet and visited with [place]. Patient interacted appropriately with peers, LRT and dog team.    Jearl Klinefelter, LRT/CTRS  Jearl Klinefelter 07/18/2012 4:50 PM

## 2012-07-21 NOTE — Progress Notes (Signed)
Patient Discharge Instructions:  After Visit Summary (AVS):   Faxed to:  07/21/12 Discharge Summary Note:   Faxed to:  07/21/12 Psychiatric Admission Assessment Note:   Faxed to:  07/21/12 Suicide Risk Assessment - Discharge Assessment:   Faxed to:  07/21/12 Faxed/Sent to the Next Level Care provider:  07/21/12 Faxed to [facility] @ [phone]  Jerelene Redden, 07/21/2012, 2:45 PM

## 2013-11-24 IMAGING — CT CT CERVICAL SPINE W/O CM
3 of 4 series · 13 of 33 positions shown, 16 images · non-contrast
Comparison: 12/09/2011; 03/22/2012

CT HEAD

CLINICAL DATA: Confusion.  Dizziness.  Shortness of breath.

CT HEAD WITHOUT CONTRAST
CT CERVICAL SPINE WITHOUT CONTRAST
TECHNIQUE: Multidetector CT imaging of the head and cervical spine
was performed following the standard protocol without intravenous
contrast.  Multiplanar CT image reconstructions of the cervical
spine were also generated.

[Series 3: c_spine 2.0 b41s st · axial · 0.28mm/px · z∈[+882,+1000]mm · 5 of 89 slices shown, 7 images]
[im 15/89  soft-tissue]
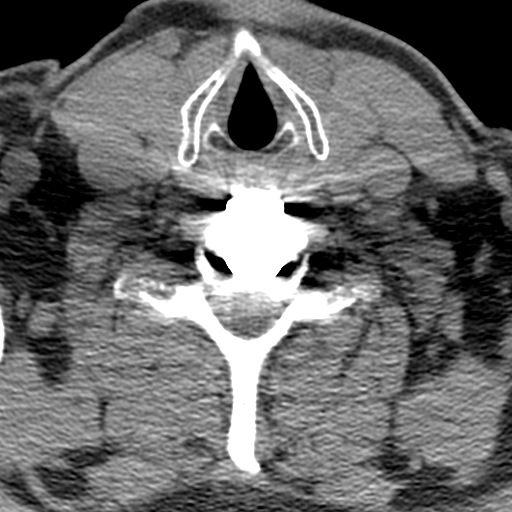
[im 15/89  bone]
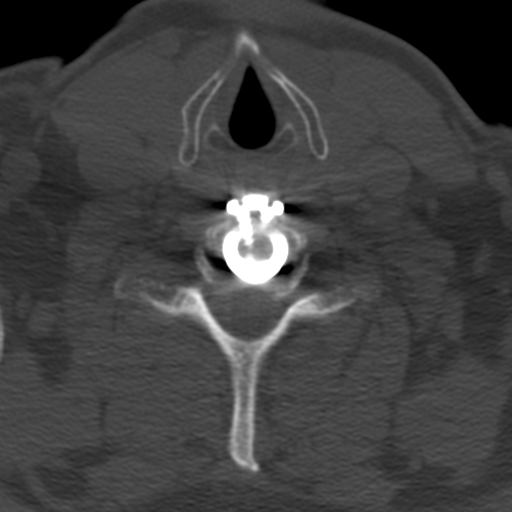
[im 30/89  bone]
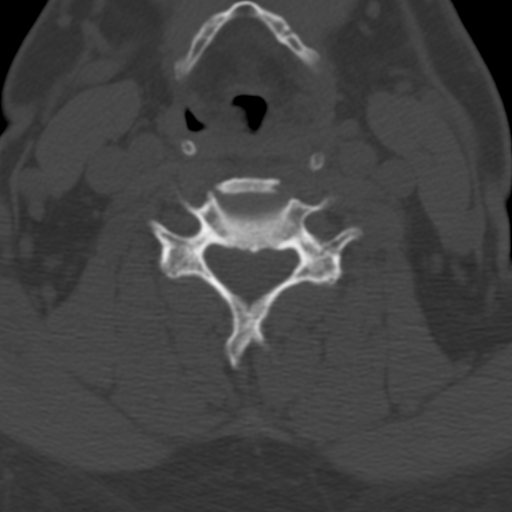
[im 45/89  bone]
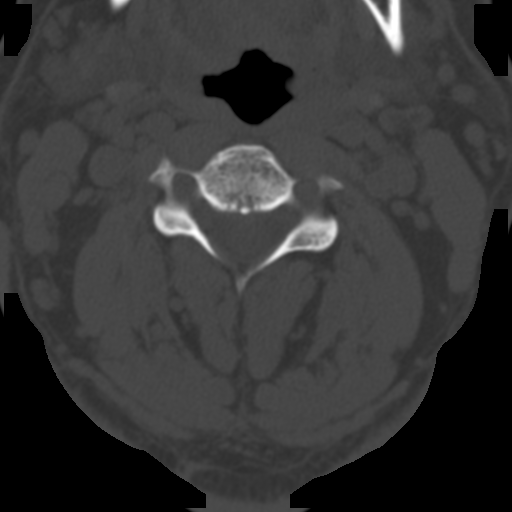
[im 59/89  bone]
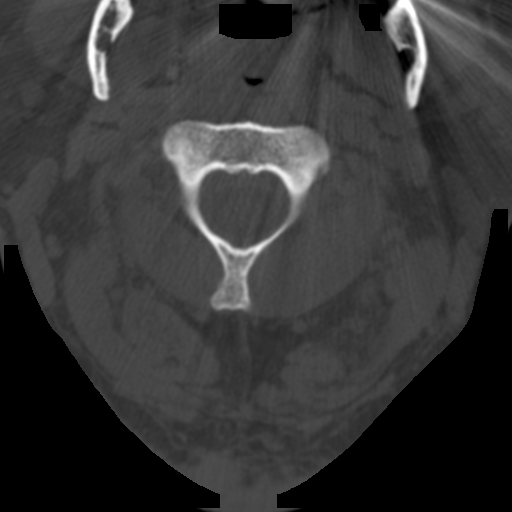
[im 74/89  soft-tissue]
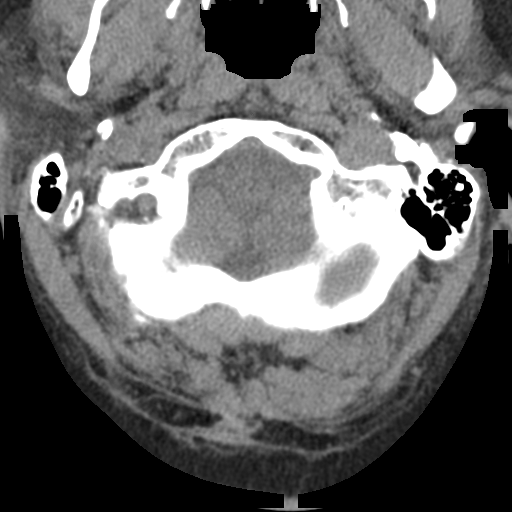
[im 74/89  bone]
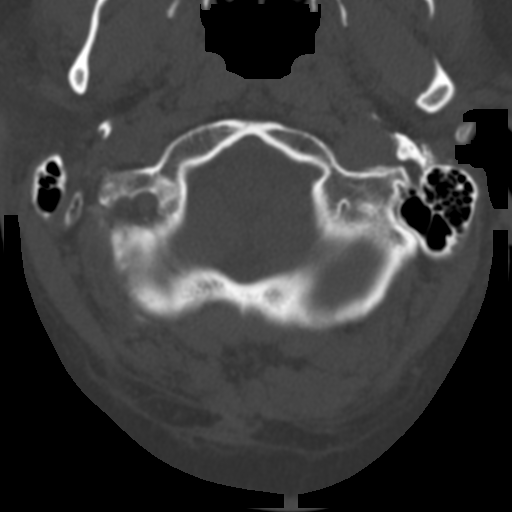

[Series 6: c_spine 2.0 coronal · coronal · 0.25mm/px · 3 of 61 slices shown]
[im 13/61  bone]
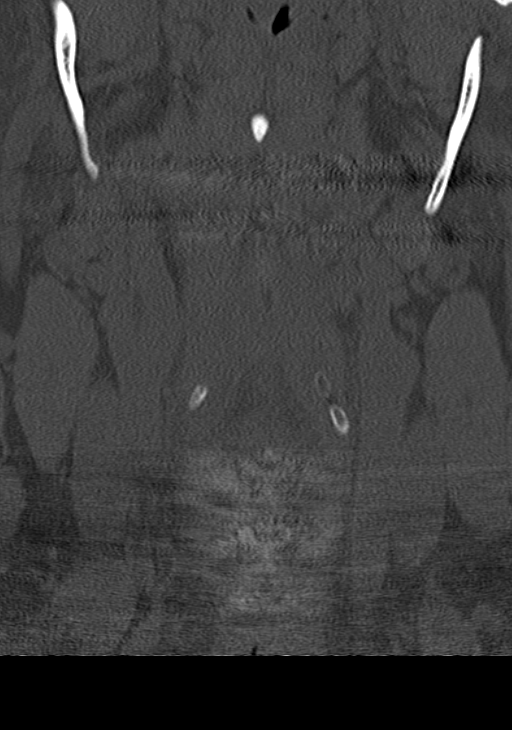
[im 25/61  bone]
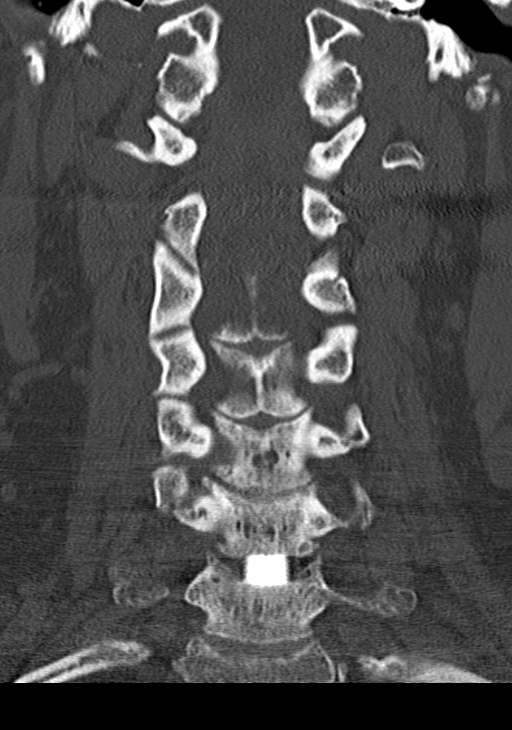
[im 37/61  bone]
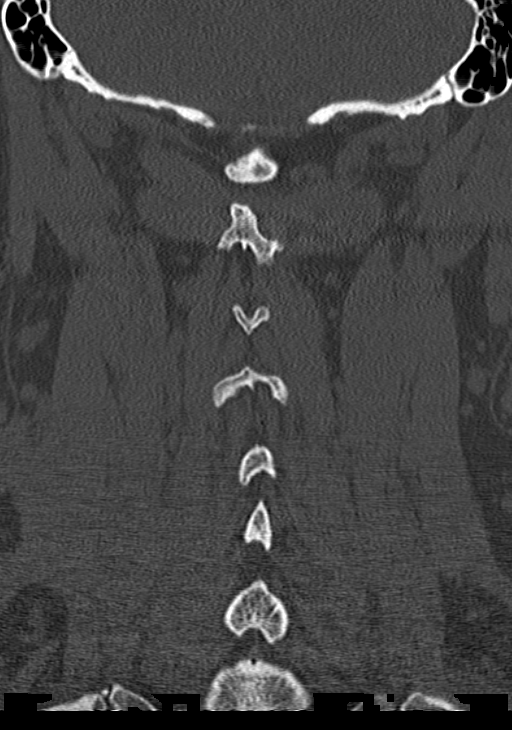

[Series 7: c_spine 2.0 sagittal · sagittal · 0.26mm/px · 5 of 66 slices shown, 6 images]
[im 22/66  bone]
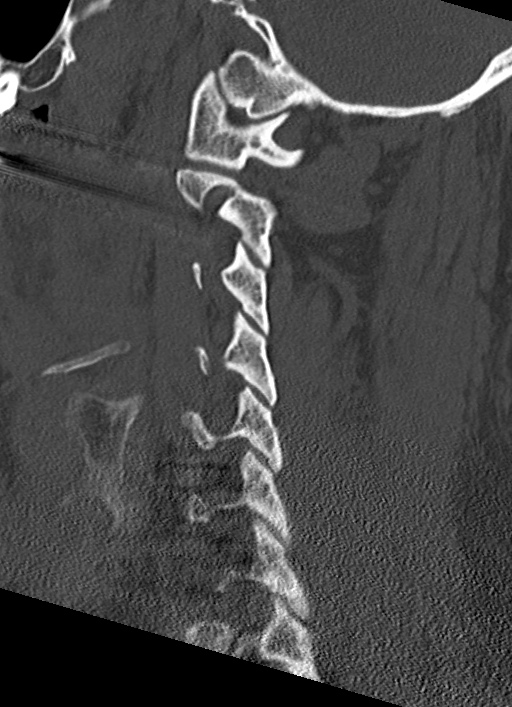
[im 28/66  bone]
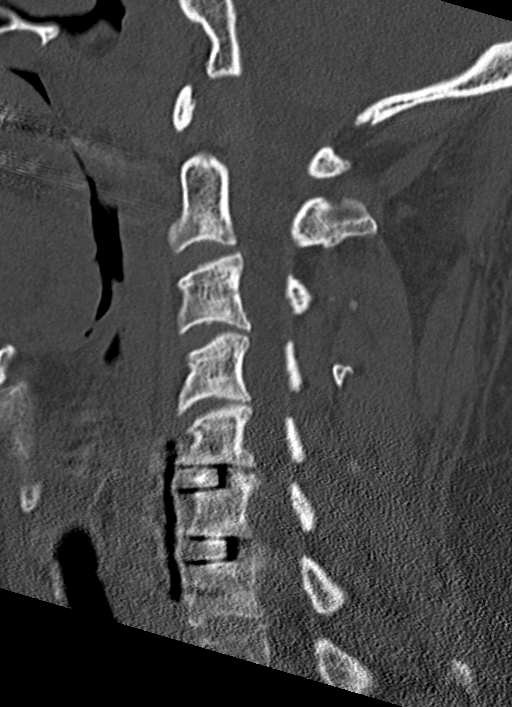
[im 33/66  soft-tissue]
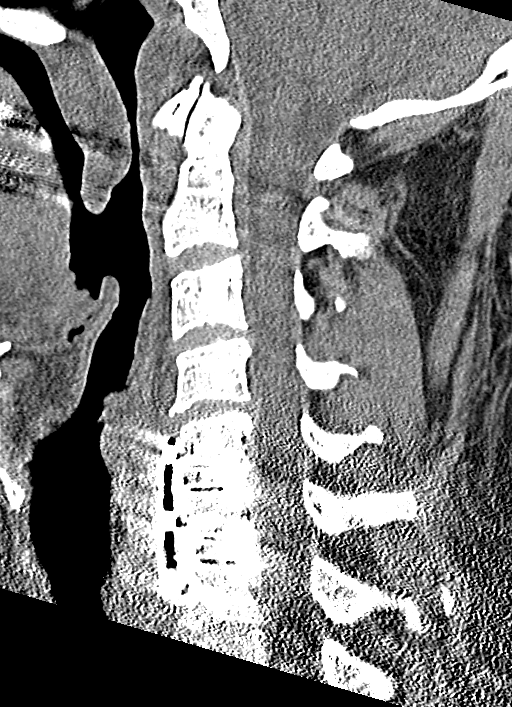
[im 33/66  bone]
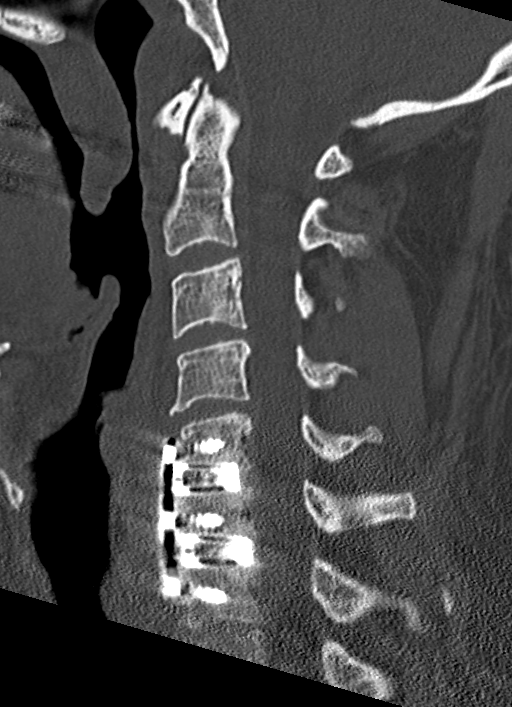
[im 38/66  bone]
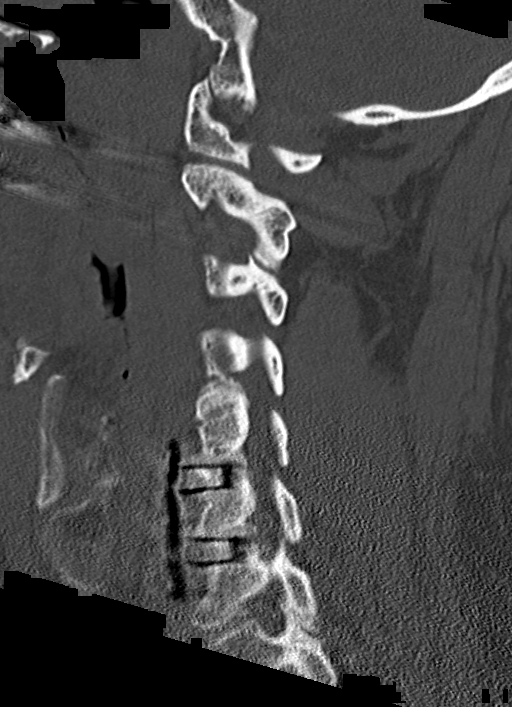
[im 44/66  bone]
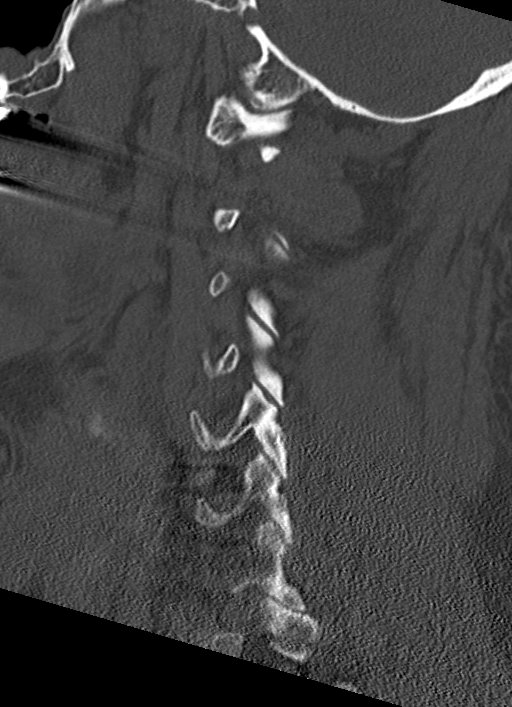

[13 of 33 positions shown; findings below may reference images not displayed]

FINDINGS: The brain stem, cerebellum, cerebral peduncles, thalami,
basal ganglia, basilar cisterns, and ventricular system appear
unremarkable.

No intracranial hemorrhage, mass lesion, or acute infarction is
identified.
IMPRESSION: No significant abnormality identified.

CT CERVICAL SPINE
FINDINGS: Anterior plate and screw fixator noted at C5-C6-C7 with
solid interbody fusion

Posterior osseous ridging noted at C3-4, C4-5, and C5-6.  There is
uncinate spurring at each of these levels, causing osseous
foraminal stenosis on the right at C5-6 and a lesser degree of
osseous foraminal narrowing on the right at C3-4 and C4-5.

A left-sided station II lymph node measures 10 mm in diameter, at
the upper limits of normal. No cervical spine fracture or
subluxation identified.
IMPRESSION: 1.  No acute cervical spine findings.
2.  Uncinate spurring causes right foraminal stenosis at C5-6 and
equivocal right foraminal narrowing at C3-4 and C4-5.
3.  Solid interbody fusion at C5-6 and C6-7.
4.  Borderline enlarged left station II lymph node, 10 mm in short
axis.

## 2013-11-24 IMAGING — CR DG CHEST 2V
2 series · 2 of 2 positions shown · non-contrast
Comparison: 12/09/2011.

CLINICAL DATA: Shortness of breath and dizziness.

CHEST - 2 VIEW

[w chest pa]
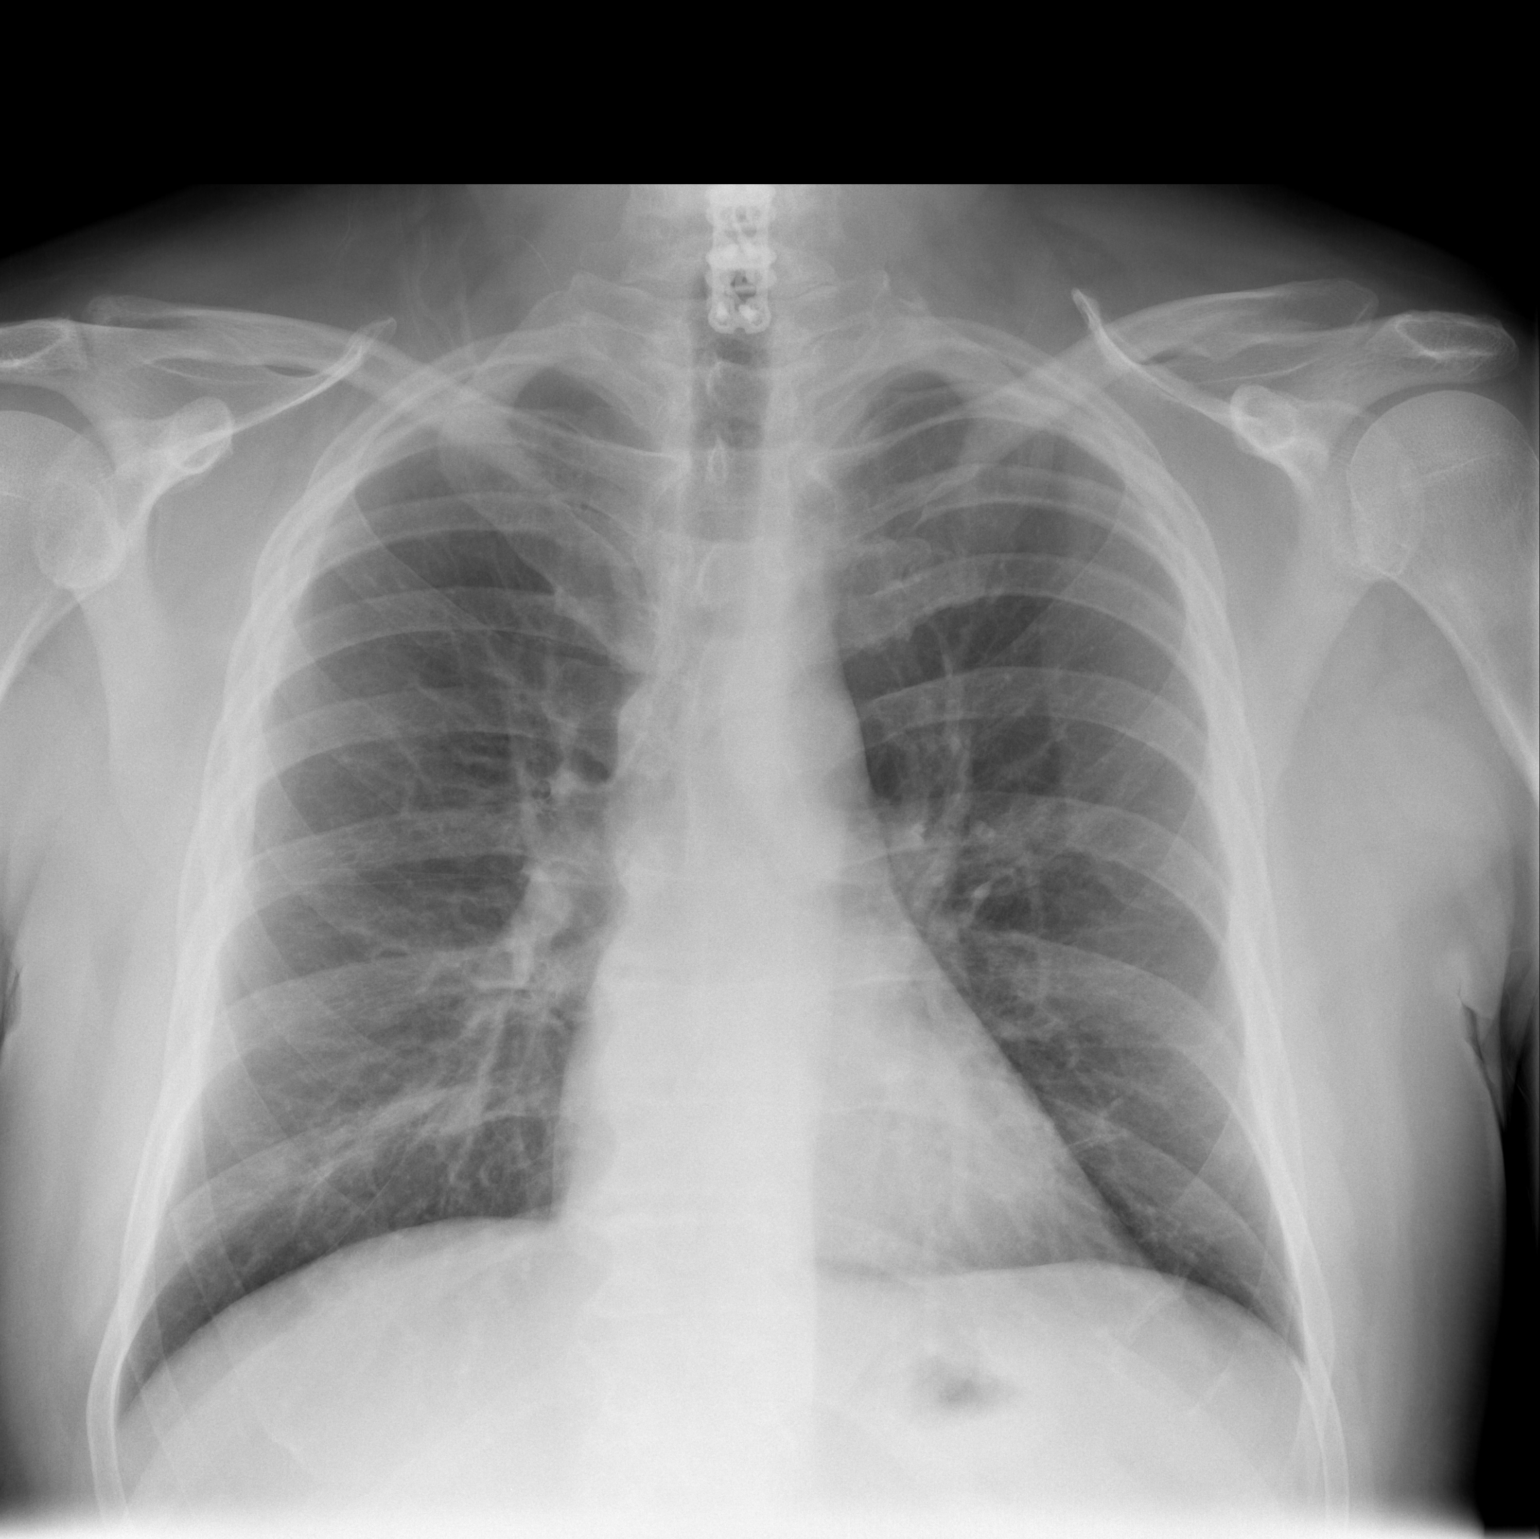

[w chest lat]
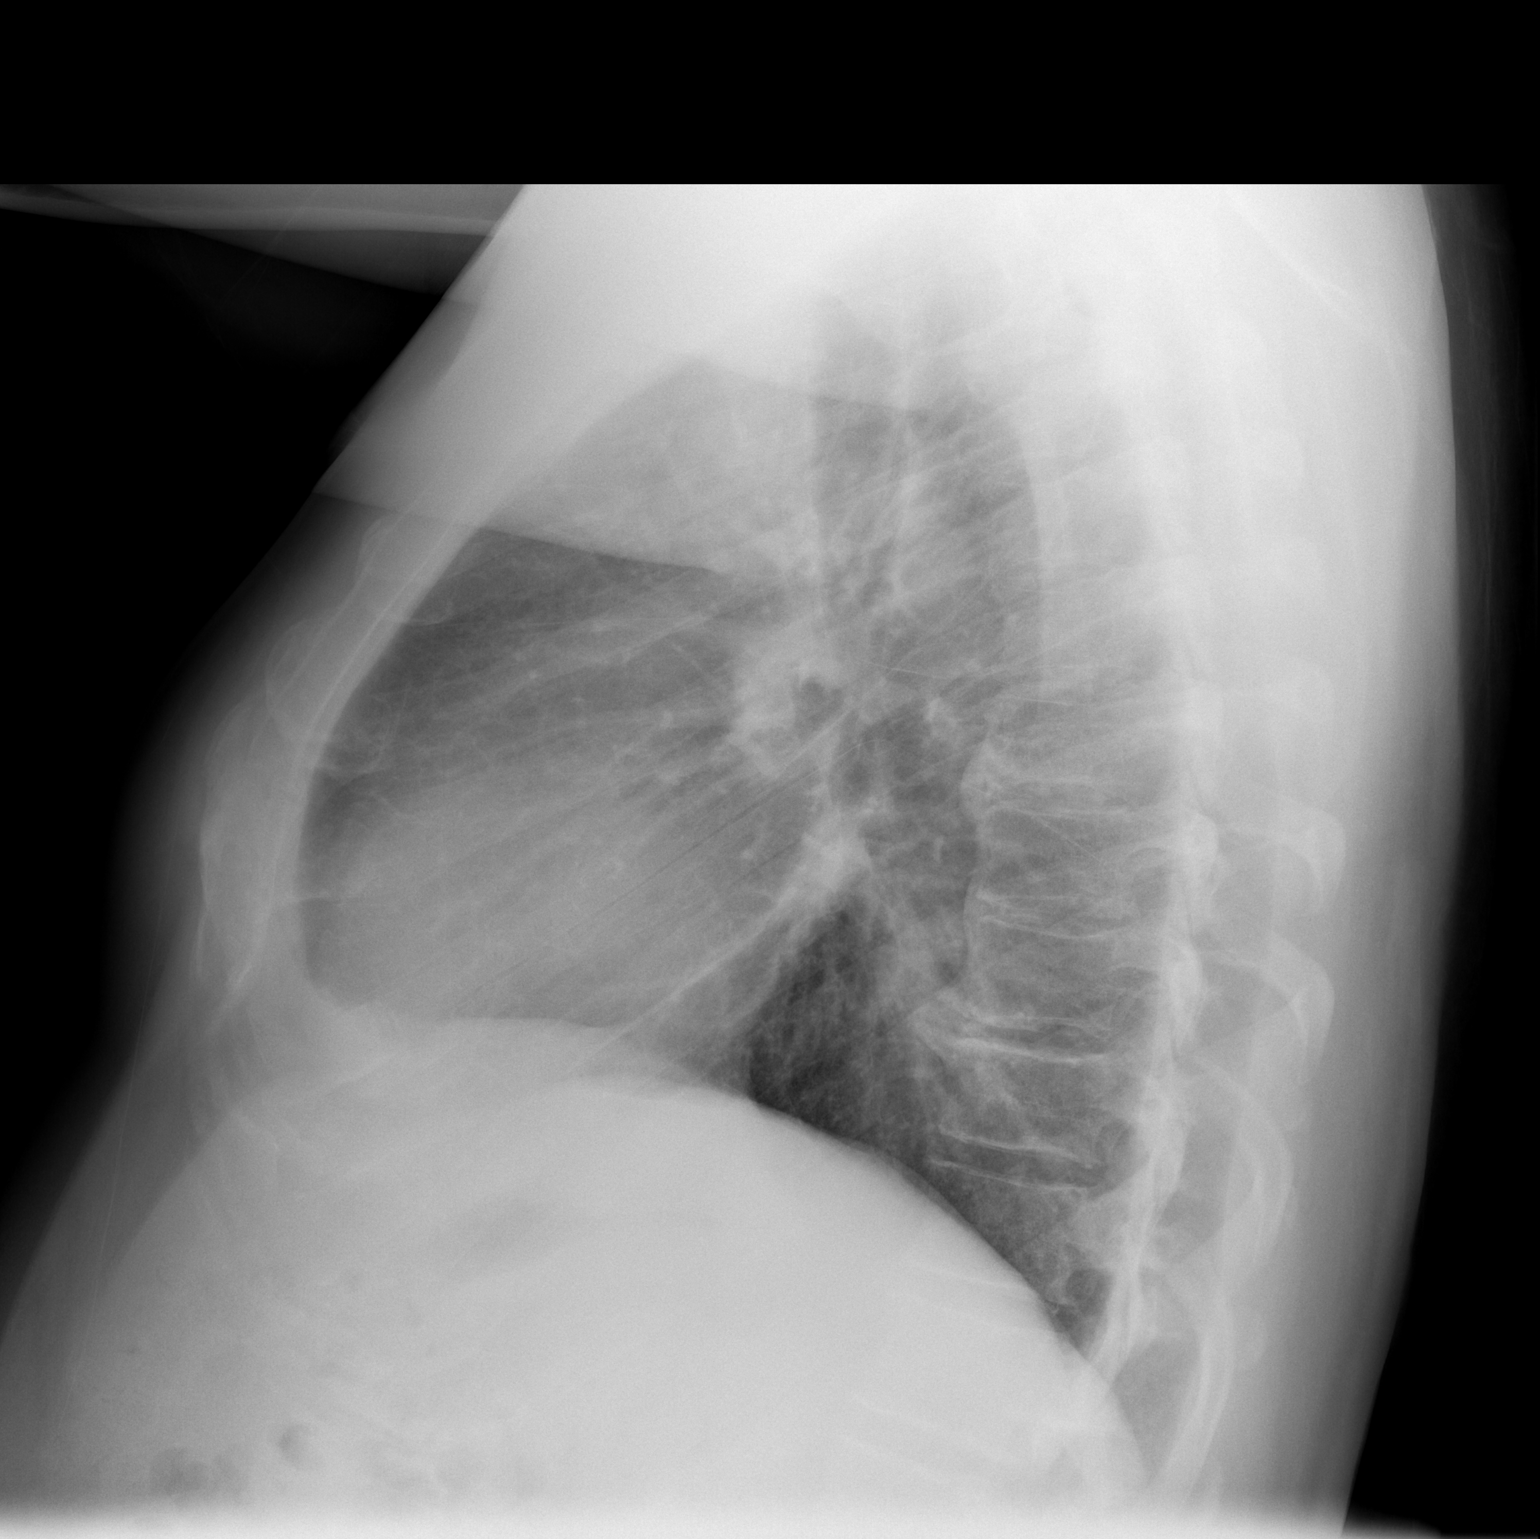

[2 of 2 positions shown; findings below may reference images not displayed]

FINDINGS: The cardiac silhouette, mediastinal and hilar contours
are normal.  The lungs are clear.  No pleural effusion.  The bony
thorax is intact.  Cervical fusion hardware noted.
IMPRESSION: No acute cardiopulmonary findings.

## 2015-07-21 ENCOUNTER — Encounter (HOSPITAL_COMMUNITY): Payer: Self-pay | Admitting: Emergency Medicine

## 2015-07-21 ENCOUNTER — Emergency Department (HOSPITAL_COMMUNITY)

## 2015-07-21 ENCOUNTER — Emergency Department (HOSPITAL_COMMUNITY)
Admission: EM | Admit: 2015-07-21 | Discharge: 2015-07-21 | Disposition: A | Discharge status: Home / Self Care | Attending: Emergency Medicine | Admitting: Emergency Medicine

## 2015-07-21 DIAGNOSIS — Z87891 Personal history of nicotine dependence: Secondary | ICD-10-CM | POA: Diagnosis not present

## 2015-07-21 DIAGNOSIS — Z79899 Other long term (current) drug therapy: Secondary | ICD-10-CM | POA: Diagnosis not present

## 2015-07-21 DIAGNOSIS — Y939 Activity, unspecified: Secondary | ICD-10-CM | POA: Diagnosis not present

## 2015-07-21 DIAGNOSIS — S4991XA Unspecified injury of right shoulder and upper arm, initial encounter: Secondary | ICD-10-CM | POA: Diagnosis present

## 2015-07-21 DIAGNOSIS — Y999 Unspecified external cause status: Secondary | ICD-10-CM | POA: Insufficient documentation

## 2015-07-21 DIAGNOSIS — S46811A Strain of other muscles, fascia and tendons at shoulder and upper arm level, right arm, initial encounter: Secondary | ICD-10-CM | POA: Insufficient documentation

## 2015-07-21 DIAGNOSIS — Y92410 Unspecified street and highway as the place of occurrence of the external cause: Secondary | ICD-10-CM | POA: Diagnosis not present

## 2015-07-21 DIAGNOSIS — R410 Disorientation, unspecified: Secondary | ICD-10-CM | POA: Insufficient documentation

## 2015-07-21 LAB — CBC WITH DIFFERENTIAL/PLATELET
BASOS PCT: 0 %
Basophils Absolute: 0 10*3/uL (ref 0.0–0.1)
EOS PCT: 2 %
Eosinophils Absolute: 0.1 10*3/uL (ref 0.0–0.7)
HEMATOCRIT: 43.6 % (ref 39.0–52.0)
Hemoglobin: 14.4 g/dL (ref 13.0–17.0)
Lymphocytes Relative: 23 %
Lymphs Abs: 1.2 10*3/uL (ref 0.7–4.0)
MCH: 30.4 pg (ref 26.0–34.0)
MCHC: 33 g/dL (ref 30.0–36.0)
MCV: 92 fL (ref 78.0–100.0)
MONO ABS: 0.5 10*3/uL (ref 0.1–1.0)
MONOS PCT: 9 %
NEUTROS ABS: 3.5 10*3/uL (ref 1.7–7.7)
Neutrophils Relative %: 66 %
PLATELETS: 163 10*3/uL (ref 150–400)
RBC: 4.74 MIL/uL (ref 4.22–5.81)
RDW: 13.3 % (ref 11.5–15.5)
WBC: 5.4 10*3/uL (ref 4.0–10.5)

## 2015-07-21 LAB — RAPID URINE DRUG SCREEN, HOSP PERFORMED
Amphetamines: NOT DETECTED
BENZODIAZEPINES: NOT DETECTED
Barbiturates: NOT DETECTED
COCAINE: NOT DETECTED
Opiates: NOT DETECTED
Tetrahydrocannabinol: NOT DETECTED

## 2015-07-21 LAB — COMPREHENSIVE METABOLIC PANEL
ALBUMIN: 3.9 g/dL (ref 3.5–5.0)
ALT: 34 U/L (ref 17–63)
ANION GAP: 7 (ref 5–15)
AST: 22 U/L (ref 15–41)
Alkaline Phosphatase: 57 U/L (ref 38–126)
BILIRUBIN TOTAL: 0.7 mg/dL (ref 0.3–1.2)
BUN: 24 mg/dL — ABNORMAL HIGH (ref 6–20)
CO2: 27 mmol/L (ref 22–32)
Calcium: 9.4 mg/dL (ref 8.9–10.3)
Chloride: 105 mmol/L (ref 101–111)
Creatinine, Ser: 1.46 mg/dL — ABNORMAL HIGH (ref 0.61–1.24)
GFR calc Af Amer: >60 mL/min (ref >60)
GFR, EST NON AFRICAN AMERICAN: 52 mL/min — AB (ref >60)
GLUCOSE: 108 mg/dL — AB (ref 65–99)
POTASSIUM: 4.8 mmol/L (ref 3.5–5.1)
Sodium: 139 mmol/L (ref 135–145)
TOTAL PROTEIN: 7.1 g/dL (ref 6.5–8.1)

## 2015-07-21 LAB — URINALYSIS, ROUTINE W REFLEX MICROSCOPIC
Bilirubin Urine: NEGATIVE
GLUCOSE, UA: NEGATIVE mg/dL
HGB URINE DIPSTICK: NEGATIVE
Ketones, ur: NEGATIVE mg/dL
Leukocytes, UA: NEGATIVE
Nitrite: NEGATIVE
Protein, ur: NEGATIVE mg/dL
SPECIFIC GRAVITY, URINE: 1.011 (ref 1.005–1.030)
pH: 5.5 (ref 5.0–8.0)

## 2015-07-21 LAB — ETHANOL

## 2015-07-21 MED ORDER — METHOCARBAMOL 500 MG PO TABS
1000.0000 mg | ORAL_TABLET | Freq: Once | ORAL | Status: AC
  Administered 2015-07-21: 1000 mg via ORAL
  Filled 2015-07-21: qty 2

## 2015-07-21 MED ORDER — IBUPROFEN 400 MG PO TABS
600.0000 mg | ORAL_TABLET | Freq: Once | ORAL | Status: AC
  Administered 2015-07-21: 600 mg via ORAL
  Filled 2015-07-21: qty 1

## 2015-07-21 MED ORDER — HYDROCODONE-ACETAMINOPHEN 5-325 MG PO TABS
2.0000 | ORAL_TABLET | Freq: Once | ORAL | Status: AC
  Administered 2015-07-21: 2 via ORAL
  Filled 2015-07-21: qty 2

## 2015-07-21 MED ORDER — IBUPROFEN 600 MG PO TABS: 600.0000 mg | ORAL_TABLET | Freq: Four times a day (QID) | ORAL | Status: AC | PRN

## 2015-07-21 MED ORDER — METHOCARBAMOL 500 MG PO TABS: 1000.0000 mg | ORAL_TABLET | Freq: Three times a day (TID) | ORAL | Status: AC | PRN
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
## 2019-04-13 ENCOUNTER — Ambulatory Visit: Attending: Internal Medicine

## 2019-04-13 DIAGNOSIS — Z23 Encounter for immunization: Secondary | ICD-10-CM

## 2019-04-13 NOTE — Progress Notes (Signed)
   Covid-19 Vaccination Clinic  Name:  Yidel Teuscher    MRN: [id number] DOB: 19-Apr-1960  04/13/2019  Mr. Hernon was observed post Covid-19 immunization for 15 minutes without incident. He was provided with Vaccine Information Sheet and instruction to access the V-Safe system.   Mr. Thall was instructed to call 911 with any severe reactions post vaccine: Marland Kitchen Difficulty breathing  . Swelling of face and throat  . A fast heartbeat  . A bad rash all over body  . Dizziness and weakness   Immunizations Administered    Name Date Dose VIS Date Route   Moderna COVID-19 Vaccine 04/13/2019  8:27 AM 0.5 mL 12/26/2018 Intramuscular   Manufacturer: Moderna   Lot: [id number]   NDC: 40814-481-85

## 2019-05-15 ENCOUNTER — Ambulatory Visit: Attending: Internal Medicine

## 2019-05-15 DIAGNOSIS — Z23 Encounter for immunization: Secondary | ICD-10-CM

## 2019-05-15 NOTE — Progress Notes (Signed)
   Covid-19 Vaccination Clinic  Name:  Jamion Carter    MRN: [id number] DOB: 02-27-1960  05/15/2019  Mr. Deines was observed post Covid-19 immunization for 15 minutes without incident. He was provided with Vaccine Information Sheet and instruction to access the V-Safe system.   Mr. Poplaski was instructed to call 911 with any severe reactions post vaccine: Marland Kitchen Difficulty breathing  . Swelling of face and throat  . A fast heartbeat  . A bad rash all over body  . Dizziness and weakness   Immunizations Administered    Name Date Dose VIS Date Route   Moderna COVID-19 Vaccine 05/15/2019  8:15 AM 0.5 mL 12/2018 Intramuscular   Manufacturer: Moderna   Lot: [id number]   NDC: 45146-047-99
